# Patient Record
Sex: Female | Born: 1950 | Race: White | Hispanic: No | Marital: Married | Smoking: Never smoker
Health system: Southern US, Community
[De-identification: ages and names within clinical notes are randomized; demographics above are authoritative.]

## PROBLEM LIST (undated history)

## (undated) DIAGNOSIS — Z86718 Personal history of other venous thrombosis and embolism: Secondary | ICD-10-CM

## (undated) DIAGNOSIS — E611 Iron deficiency: Secondary | ICD-10-CM

## (undated) DIAGNOSIS — I10 Essential (primary) hypertension: Secondary | ICD-10-CM

## (undated) DIAGNOSIS — F1320 Sedative, hypnotic or anxiolytic dependence, uncomplicated: Secondary | ICD-10-CM

## (undated) DIAGNOSIS — F603 Borderline personality disorder: Secondary | ICD-10-CM

## (undated) DIAGNOSIS — C55 Malignant neoplasm of uterus, part unspecified: Secondary | ICD-10-CM

## (undated) DIAGNOSIS — F329 Major depressive disorder, single episode, unspecified: Secondary | ICD-10-CM

## (undated) DIAGNOSIS — G2581 Restless legs syndrome: Secondary | ICD-10-CM

## (undated) DIAGNOSIS — I272 Pulmonary hypertension, unspecified: Secondary | ICD-10-CM

## (undated) DIAGNOSIS — G43909 Migraine, unspecified, not intractable, without status migrainosus: Secondary | ICD-10-CM

## (undated) DIAGNOSIS — F419 Anxiety disorder, unspecified: Secondary | ICD-10-CM

## (undated) HISTORY — DX: Anxiety disorder, unspecified: F41.9

## (undated) HISTORY — DX: Malignant neoplasm of uterus, part unspecified: C55

## (undated) HISTORY — DX: Essential (primary) hypertension: I10

## (undated) HISTORY — PX: CARDIAC CATHETERIZATION: SHX172

## (undated) HISTORY — PX: ABDOMINAL HYSTERECTOMY: SHX81

## (undated) HISTORY — PX: KNEE ARTHROSCOPY: SUR90

---
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
## 2016-01-12 DIAGNOSIS — F329 Major depressive disorder, single episode, unspecified: Secondary | ICD-10-CM

## 2016-01-12 DIAGNOSIS — G43909 Migraine, unspecified, not intractable, without status migrainosus: Secondary | ICD-10-CM

## 2016-01-12 HISTORY — DX: Migraine, unspecified, not intractable, without status migrainosus: G43.909

## 2016-01-12 HISTORY — DX: Major depressive disorder, single episode, unspecified: F32.9

## 2016-07-03 DIAGNOSIS — F603 Borderline personality disorder: Secondary | ICD-10-CM

## 2016-07-03 HISTORY — DX: Borderline personality disorder: F60.3

## 2017-03-07 DIAGNOSIS — G2581 Restless legs syndrome: Secondary | ICD-10-CM

## 2017-03-07 HISTORY — DX: Restless legs syndrome: G25.81

## 2017-08-23 DIAGNOSIS — I272 Pulmonary hypertension, unspecified: Secondary | ICD-10-CM

## 2017-08-23 HISTORY — DX: Pulmonary hypertension, unspecified: I27.20

## 2017-08-24 DIAGNOSIS — E611 Iron deficiency: Secondary | ICD-10-CM

## 2017-08-24 HISTORY — DX: Iron deficiency: E61.1

## 2017-09-08 DIAGNOSIS — Z86718 Personal history of other venous thrombosis and embolism: Secondary | ICD-10-CM

## 2017-09-08 HISTORY — DX: Personal history of other venous thrombosis and embolism: Z86.718

## 2017-11-10 DIAGNOSIS — F1320 Sedative, hypnotic or anxiolytic dependence, uncomplicated: Secondary | ICD-10-CM

## 2017-11-10 HISTORY — DX: Sedative, hypnotic or anxiolytic dependence, uncomplicated: F13.20

## 2018-07-25 ENCOUNTER — Telehealth: Payer: Self-pay | Admitting: *Deleted

## 2018-07-25 NOTE — Telephone Encounter (Addendum)
Patient called back and was giving an appt time of 7/10 at 9:30am. Gave  The patient address, instructions on no visitors, masking and check in. Per patient request I have faxed directions and maps

## 2018-07-25 NOTE — Telephone Encounter (Signed)
Called and left the patient a message to call the office back. Need to schedule a new patient appt  

## 2018-07-26 ENCOUNTER — Telehealth: Payer: Self-pay

## 2018-07-26 NOTE — Telephone Encounter (Signed)
LM with receptionist that pt has appointment 08-11-18 at 0930 with Dr. Everitt Amber. Receptionist will relay this information to [place] as she requested.

## 2018-08-11 ENCOUNTER — Encounter: Payer: Self-pay | Admitting: Hematology and Oncology

## 2018-08-11 ENCOUNTER — Other Ambulatory Visit: Payer: Self-pay

## 2018-08-11 ENCOUNTER — Encounter: Payer: Self-pay | Admitting: Gynecologic Oncology

## 2018-08-11 ENCOUNTER — Inpatient Hospital Stay: Payer: Medicare Other

## 2018-08-11 ENCOUNTER — Encounter: Payer: Self-pay | Admitting: Oncology

## 2018-08-11 ENCOUNTER — Telehealth: Payer: Self-pay | Admitting: Oncology

## 2018-08-11 ENCOUNTER — Inpatient Hospital Stay: Payer: Medicare Other | Attending: Gynecologic Oncology | Admitting: Gynecologic Oncology

## 2018-08-11 VITALS — BP 169/96 | HR 65 | Temp 98.5°F | Resp 17 | Ht 59.0 in | Wt 156.0 lb

## 2018-08-11 DIAGNOSIS — Z86718 Personal history of other venous thrombosis and embolism: Secondary | ICD-10-CM | POA: Diagnosis not present

## 2018-08-11 DIAGNOSIS — Z79899 Other long term (current) drug therapy: Secondary | ICD-10-CM | POA: Insufficient documentation

## 2018-08-11 DIAGNOSIS — C55 Malignant neoplasm of uterus, part unspecified: Secondary | ICD-10-CM | POA: Insufficient documentation

## 2018-08-11 DIAGNOSIS — C541 Malignant neoplasm of endometrium: Secondary | ICD-10-CM

## 2018-08-11 DIAGNOSIS — F1320 Sedative, hypnotic or anxiolytic dependence, uncomplicated: Secondary | ICD-10-CM | POA: Insufficient documentation

## 2018-08-11 DIAGNOSIS — G2581 Restless legs syndrome: Secondary | ICD-10-CM | POA: Diagnosis not present

## 2018-08-11 DIAGNOSIS — F603 Borderline personality disorder: Secondary | ICD-10-CM | POA: Diagnosis not present

## 2018-08-11 DIAGNOSIS — I272 Pulmonary hypertension, unspecified: Secondary | ICD-10-CM | POA: Insufficient documentation

## 2018-08-11 DIAGNOSIS — Z6831 Body mass index (BMI) 31.0-31.9, adult: Secondary | ICD-10-CM | POA: Diagnosis not present

## 2018-08-11 DIAGNOSIS — F329 Major depressive disorder, single episode, unspecified: Secondary | ICD-10-CM | POA: Insufficient documentation

## 2018-08-11 DIAGNOSIS — Z90710 Acquired absence of both cervix and uterus: Secondary | ICD-10-CM

## 2018-08-11 DIAGNOSIS — E669 Obesity, unspecified: Secondary | ICD-10-CM | POA: Insufficient documentation

## 2018-08-11 DIAGNOSIS — Z90721 Acquired absence of ovaries, unilateral: Secondary | ICD-10-CM

## 2018-08-11 DIAGNOSIS — I10 Essential (primary) hypertension: Secondary | ICD-10-CM | POA: Insufficient documentation

## 2018-08-11 DIAGNOSIS — F419 Anxiety disorder, unspecified: Secondary | ICD-10-CM | POA: Diagnosis not present

## 2018-08-11 DIAGNOSIS — C482 Malignant neoplasm of peritoneum, unspecified: Secondary | ICD-10-CM | POA: Diagnosis present

## 2018-08-11 LAB — BASIC METABOLIC PANEL
Anion gap: 11 (ref 5–15)
BUN: 12 mg/dL (ref 8–23)
CO2: 23 mmol/L (ref 22–32)
Calcium: 8.4 mg/dL — ABNORMAL LOW (ref 8.9–10.3)
Chloride: 111 mmol/L (ref 98–111)
Creatinine, Ser: 0.68 mg/dL (ref 0.44–1.00)
GFR calc Af Amer: >60 mL/min (ref >60)
GFR calc non Af Amer: >60 mL/min (ref >60)
Glucose, Bld: 91 mg/dL (ref 70–99)
Potassium: 3.7 mmol/L (ref 3.5–5.1)
Sodium: 145 mmol/L (ref 135–145)

## 2018-08-11 NOTE — Patient Instructions (Addendum)
You carry a diagnosis of stage 1B uterine (endometrial) cancer. 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 Therefore Dr Denman George is recommending scans of the abdomen and chest to look for obvious spread of the cancer. She is also recommending 6 doses of chemotherapy and 5 doses of vaginal radiation to decrease the chance that the cancer will return.   You will have visits scheduled with the chemo doctor and the radiation doctor to discuss further.   Dr Serita Grit office can be reached at [phone].

## 2018-08-11 NOTE — Telephone Encounter (Signed)
Called Robin at [facility] and requested MSI and Her2 testing on accession [id number].   

## 2018-08-11 NOTE — Progress Notes (Signed)
Met with Christina Hickman after first [place] appointment.  Discussed appointments with Dr. Alvy Bimler and Dr. Sondra Come and gave her CT instructions for 08/15/18.  She has been given the [employer] and was advised to call with any questions or concerns.

## 2018-08-11 NOTE — Progress Notes (Signed)
Consult Note: Gyn-Onc  Consult was requested by Dr. Adah Perl for the evaluation of Christina Hickman 68 y.o. female  CC:  Chief Complaint  Patient presents with  . Primary peritoneal adenocarcinoma [facility])    Assessment/Plan:  Christina Hickman  is a 68 y.o.  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  Madesyn was on staged and the left ovary remains, however we will obtain radiographic staging with CT chest abdomen and pelvis.  Provided no gross residual disease is remaining on scans she is a candidate for adjuvant therapy given her deeply invasive high-grade endometrial cancer.  Therefore it is unlikely that staging surgery would have any benefit for her in the setting now.  I am recommending 6 cycles of carboplatin paclitaxel chemotherapy with vaginal brachii therapy for high intermediate risk factor uterine cancer.  I explained these treatments to the patient and the indication and prognosis of her underlying cancer.  We will proceed with imaging and referral to medical oncology and radiation oncology.  We will obtain HER-2 testing on the serous portion of her serous endometrial cancer and consider trastuzumab if she is HER-2 positive.   HPI: Christina Hickman is a 68 year old P1 who is seen in consultation at the request of Dr Adah Perl for evaluation of endometrial cancer.  She had experienced vaginal postmenopausal bleeding in March, 2020 which became heavier through May, 2020.  Reports "years" of vaginal bleeding after heavy straining/lifting (eg using the push mower).    She saw Dr Adah Perl in [place] on 06/06/18 and a pap was performed which showed ASCUS. She also performed a TVUS which showed a uterus measuring 8.6x2.8x4.6cm with a thickened 3cm endometrum with multiple cystic areas throughout the endometrium.  An endometrial biopsy was performed on 06/19/18 which showed a  "benign tumor" (pathology report from 06/19/18 showed an endometrial polyp). She was set up for hysterectomy.  On 07/17/18 she underwent an LAVH and right salpingo-oophorectomy. The left tube and ovary was adherent to the sigmoid colon and was not removed.   The pathology revealed a mixed endometrioid and serous carcinoma of the endometrium (FIGO grade 3). It showed 64m of 169mmyometrial invasion (outer half), no LVSI. Lymph nodes were not examined. The cervical stroma and right tube and ovary were normal.  She had no issues postoperatively.   Her medical history is significant for anxiety and depression, borderline PD, essential HTN, restless legs, pulmonary HTN, obesity (BMI 31), history of DVT (2015 approximately - on blood thinner for 6 months possibly).  Family history is significant for no malignancy.  Her surgical history is significant for right knee surgery and a hysterectomy.   Current Meds:  Outpatient Encounter Medications as of 08/11/2018  Medication Sig  . clonazePAM (KLONOPIN) 1 MG tablet 1 mg 2 (two) times daily.   . Nebivolol HCl (BYSTOLIC) 20 MG TABS Bystolic 20 mg tablet  TAKE ONE TABLET BY MOUTH DAILY  . omeprazole (PRILOSEC) 40 MG capsule Take 40 mg by mouth daily.   . Marland Kitchencetaminophen (TYLENOL) 500 MG tablet Take 500 mg by mouth every 6 (six) hours as needed.  . Marland KitchenPINEPHrine 0.3 mg/0.3 mL IJ SOAJ injection epinephrine 0.3 mg/0.3 mL injection, auto-injector  USE AS DIRECTED  . nitroGLYCERIN (NITROSTAT) 0.4 MG SL tablet nitroglycerin 0.4 mg sublingual tablet  . [DISCONTINUED] aspirin 81 MG EC tablet Take 81 mg by mouth daily. Swallow whole.  . [DISCONTINUED] BYSTOLIC  20 MG TABS   . [DISCONTINUED] EPINEPHrine 0.3 mg/0.3 mL IJ SOAJ injection   . [DISCONTINUED] ibuprofen (ADVIL) 400 MG tablet   . [DISCONTINUED] Naproxen Sodium (ALEVE) 220 MG CAPS Take 220 mg by mouth every 8 (eight) hours as needed.   No facility-administered encounter medications on file as of 08/11/2018.      Allergy:  Allergies  Allergen Reactions  . Cephalexin Rash  . Losartan Other (See Comments)    Angioedema   . Shellfish-Derived Products Swelling  . Sulfa Antibiotics Hives  . Serotonin Other (See Comments)    Confusion  . Alendronate Other (See Comments)  . Beef-Derived Products   . Galactose   . Lisinopril Swelling    Lip swelling    Social Hx:   Social History   Socioeconomic History  . Marital status: Married    Spouse name: Not on file  . Number of children: Not on file  . Years of education: Not on file  . Highest education level: Not on file  Occupational History  . Not on file  Social Needs  . Financial resource strain: Not on file  . Food insecurity    Worry: Not on file    Inability: Not on file  . Transportation needs    Medical: Not on file    Non-medical: Not on file  Tobacco Use  . Smoking status: Never Smoker  . Smokeless tobacco: Never Used  Substance and Sexual Activity  . Alcohol use: Not Currently    Comment: former use  . Drug use: Not on file  . Sexual activity: Not Currently  Lifestyle  . Physical activity    Days per week: Not on file    Minutes per session: Not on file  . Stress: Not on file  Relationships  . Social Herbalist on phone: Not on file    Gets together: Not on file    Attends religious service: Not on file    Active member of club or organization: Not on file    Attends meetings of clubs or organizations: Not on file    Relationship status: Not on file  . Intimate partner violence    Fear of current or ex partner: Not on file    Emotionally abused: Not on file    Physically abused: Not on file    Forced sexual activity: Not on file  Other Topics Concern  . Not on file  Social History Narrative  . Not on file    Past Surgical Hx:  Past Surgical History:  Procedure Laterality Date  . CARDIAC CATHETERIZATION    . KNEE ARTHROSCOPY Right     Past Medical Hx:  Past Medical History:  Diagnosis  Date  . Anxiety   . Benzodiazepine dependence ([place]) 11/10/2017  . Borderline personality disorder ([place]) 07/03/2016  . History of deep vein thrombosis 09/08/2017  . Hypertension   . Iron deficiency 08/24/2017  . Major depressive disorder 01/12/2016  . Migraine 01/12/2016  . Pulmonary hypertension ([place]) 08/23/2017  . Restless leg 03/07/2017  . Uterine cancer [facility])     Past Gynecological History:  1 SVD. + postmenopausal bleeding. No LMP recorded.  Family Hx:  Family History  Problem Relation Age of Onset  . Heart attack Father   . Heart attack Maternal Uncle   . Diabetes Paternal Uncle     Review of Systems:  Constitutional  Feels well,    ENT Normal appearing ears and nares bilaterally Skin/Breast  No  rash, sores, jaundice, itching, dryness Cardiovascular  No chest pain, shortness of breath, or edema  Pulmonary  No cough or wheeze.  Gastro Intestinal  No nausea, vomitting, or diarrhoea. No bright red blood per rectum, no abdominal pain, change in bowel movement, or constipation.  Genito Urinary  No frequency, urgency, dysuria, no bleeding Musculo Skeletal  No myalgia, arthralgia, joint swelling or pain  Neurologic  No weakness, numbness, change in gait,  Psychology  No depression, anxiety, insomnia.   Vitals:  Blood pressure (!) 169/96, pulse 65, temperature 98.5 F (36.9 C), temperature source Oral, resp. rate 17, height 4' 11"  (1.499 m), weight 156 lb (70.8 kg), SpO2 100 %.  Physical Exam: WD in NAD Neck  Supple NROM, without any enlargements.  Lymph Node Survey No cervical supraclavicular or inguinal adenopathy Cardiovascular  Pulse normal rate, regularity and rhythm. S1 and S2 normal.  Lungs  Clear to auscultation bilateraly, without wheezes/crackles/rhonchi. Good air movement.  Skin  No rash/lesions/breakdown  Psychiatry  Alert and oriented to person, place, and time  Abdomen  Normoactive bowel sounds, abdomen soft, non-tender and obese without  evidence of hernia. Incisions healing well Back No CVA tenderness Genito Urinary  Vulva/vagina: Normal external female genitalia.   No lesions. No discharge or bleeding.  Bladder/urethra:  No lesions or masses, well supported bladder  Vagina: vaginal cuff in tact with suture material present. No lesions, no active bleeding.  Adnexa: no palpable masses. Rectal  deferred Extremities  No bilateral cyanosis, clubbing or edema.   Thereasa Solo, MD  08/11/2018, 6:39 PM

## 2018-08-14 ENCOUNTER — Telehealth: Payer: Self-pay | Admitting: *Deleted

## 2018-08-14 NOTE — Telephone Encounter (Signed)
Patient called and stated "I was reading over the instructions for the test I'm having this week, and it talked about iodine and I'm allergic. I called and talked with radiology, they tolad me to call Dr. Serita Grit office and ask for a 13 hour prep." Per Melissa APP and Dr. Denman George "it is a different type of dye and she is fine without the prep. No iodine."; gave the information to the patient. Patient verbalized understanding.

## 2018-08-14 NOTE — Progress Notes (Signed)
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  Reports "years" of vaginal bleeding after heavy straining/lifting (eg using the push mower).    She saw Dr Adah Perl in [place] on 06/06/18 and a pap was performed which showed ASCUS. She also performed a TVUS which showed a uterus measuring 8.6x2.8x4.6cm with a thickened 3cm endometrum with multiple cystic areas throughout the endometrium.  An endometrial biopsy was performed on 06/19/18 which showed a "benign tumor" (pathology report from 06/19/18 showed an endometrial polyp). She was set up for hysterectomy.  On 07/17/18 she underwent an LAVH and right salpingo-oophorectomy. The left tube and ovary was adherent to the sigmoid colon and was not removed.   The pathology revealed a mixed endometrioid and serous carcinoma of the endometrium (FIGO grade 3). It showed 27m of 162mmyometrial invasion (outer half), no LVSI. Lymph nodes were not examined. The cervical stroma and right tube and ovary were normal.  Biopsies revealed: The pathology revealed a mixed endometrioid and serous carcinoma of the endometrium (FIGO grade 3). It showed 55m37mf 23m17mometrial invasion (outer half), no LVSI. Lymph nodes were not examined. The cervical stroma and right tube and ovary were normal.  Past/Anticipated interventions by Gyn/Onc surgery, if any: None at this time. CT ABD 08/15/18: IMPRESSION: 1. Single indeterminate 8 mm aorto-caval retroperitoneal lymph node. No other signs metastatic disease within the chest, abdomen, or pelvis. 2. Moderate to large hiatal hernia. 3. Colonic diverticulosis. No radiographic evidence of diverticulitis.  Past/Anticipated interventions by medical oncology, if any: Initial consult Dr. GorsAlvy Bimler4/20: ASSESSMENT & PLAN:   [place] We discussed the role of adjuvant treatment  We reviewed the NCCN guidelines We discussed the role of chemotherapy. The intent is of curative intent.  We discussed some of the risks, benefits, side-effects of carboplatin & Taxol. Treatment is intravenous, every 3 weeks x 6 cycles  Some of the short term side-effects included, though not limited to, including weight loss, life threatening infections, risk of allergic reactions, need for transfusions of blood products, nausea, vomiting, change in bowel habits, loss of hair, admission to hospital for various reasons, and risks of death.   Long term side-effects are also discussed including risks of infertility, permanent damage to nerve function, hearing loss, chronic fatigue, kidney damage with possibility needing hemodialysis, and rare secondary malignancy including bone marrow disorders.  The patient is aware that the response rates discussed earlier is not guaranteed.  After a long discussion, patient made an informed decision to proceed with the prescribed plan of care.   Patient education material was dispensed. We discussed premedication with dexamethasone before chemotherapy. Due to her propensity for fluid retention, I will reduce oral premedication dexamethasone a little bit I do not plan prophylactic G-CSF support Due to high calculated BSA, I plan to reduce Taxol a little bit upfront I will try to get her scheduled for chemo education class, port placement and chemotherapy to start next week I will see her prior to cycle 2 of treatment We will get pathologist to add Her-2 testing on the serous component of her disease  Weight changes, if any:  Wt Readings from Last 3 Encounters:  08/16/18 152 lb 9.6 oz (69.2 kg)  08/15/18 151 lb (68.5 kg)  08/11/18 156 lb (  70.8 kg)     Bowel/Bladder complaints, if any: Pt denies dysuria/hematuria. Pt reports minimal  vaginal bleeding, bright red. Pt denies diarrhea/constipation.   Nausea/Vomiting, if any: Pt reports nausea at 0230 this am, attributes it to CT contrast/barium. Pt denies abdominal bloating.  Pain issues, if any:  Pt denies c/o pain.  SAFETY ISSUES:  Prior radiation? No  Pacemaker/ICD? No  Possible current pregnancy? No  Is the patient on methotrexate? No  Current Complaints / other details:  Pt presents today for initial consult with Dr. Sondra Come for Radiation Oncology.   BP (!) 189/87 (BP Location: Left Arm, Patient Position: Sitting)   Pulse 68   Temp 98.7 F (37.1 C) (Oral)   Resp 18   Ht 4' 11"  (1.499 m)   Wt 152 lb 9.6 oz (69.2 kg)   SpO2 100%   BMI 30.82 kg/m   Loma Sousa, RN BSN

## 2018-08-15 ENCOUNTER — Inpatient Hospital Stay: Payer: Medicare Other

## 2018-08-15 ENCOUNTER — Encounter: Payer: Self-pay | Admitting: Hematology and Oncology

## 2018-08-15 ENCOUNTER — Other Ambulatory Visit: Payer: Medicare Other

## 2018-08-15 ENCOUNTER — Inpatient Hospital Stay (HOSPITAL_BASED_OUTPATIENT_CLINIC_OR_DEPARTMENT_OTHER): Payer: Medicare Other | Admitting: Hematology and Oncology

## 2018-08-15 ENCOUNTER — Ambulatory Visit (HOSPITAL_COMMUNITY)
Admission: RE | Admit: 2018-08-15 | Discharge: 2018-08-15 | Disposition: A | Discharge status: Home / Self Care | Payer: Medicare Other | Source: Ambulatory Visit | Attending: Gynecologic Oncology | Admitting: Gynecologic Oncology

## 2018-08-15 ENCOUNTER — Other Ambulatory Visit: Payer: Self-pay

## 2018-08-15 ENCOUNTER — Encounter (HOSPITAL_COMMUNITY): Payer: Self-pay

## 2018-08-15 VITALS — BP 154/88 | HR 65 | Temp 98.3°F | Resp 18 | Ht 59.0 in | Wt 151.0 lb

## 2018-08-15 DIAGNOSIS — C541 Malignant neoplasm of endometrium: Secondary | ICD-10-CM | POA: Diagnosis present

## 2018-08-15 DIAGNOSIS — C55 Malignant neoplasm of uterus, part unspecified: Secondary | ICD-10-CM | POA: Insufficient documentation

## 2018-08-15 DIAGNOSIS — C549 Malignant neoplasm of corpus uteri, unspecified: Secondary | ICD-10-CM | POA: Diagnosis not present

## 2018-08-15 DIAGNOSIS — C482 Malignant neoplasm of peritoneum, unspecified: Secondary | ICD-10-CM | POA: Diagnosis not present

## 2018-08-15 MED ORDER — IOHEXOL 300 MG/ML  SOLN
100.0000 mL | Freq: Once | INTRAMUSCULAR | Status: AC | PRN
  Administered 2018-08-15: 08:00:00 100 mL via INTRAVENOUS

## 2018-08-15 MED ORDER — ONDANSETRON HCL 8 MG PO TABS: 8.0000 mg | ORAL_TABLET | Freq: Three times a day (TID) | ORAL | 1 refills | Status: DC | PRN

## 2018-08-15 MED ORDER — SODIUM CHLORIDE (PF) 0.9 % IJ SOLN
INTRAMUSCULAR | Status: AC
  Filled 2018-08-15: qty 50

## 2018-08-15 MED ORDER — LIDOCAINE-PRILOCAINE 2.5-2.5 % EX CREA: TOPICAL_CREAM | CUTANEOUS | 3 refills | Status: AC

## 2018-08-15 MED ORDER — DEXAMETHASONE 4 MG PO TABS: ORAL_TABLET | ORAL | 0 refills | Status: DC

## 2018-08-15 MED ORDER — PROCHLORPERAZINE MALEATE 10 MG PO TABS: 10.0000 mg | ORAL_TABLET | Freq: Four times a day (QID) | ORAL | 1 refills | Status: DC | PRN

## 2018-08-15 MED FILL — PROCHLORPERAZINE 10 MG TAB: 10 | 7 days supply | Qty: 30 | Fill #0

## 2018-08-15 MED FILL — LIDOCAINE-PRILOCAINE CREAM: 2.5-2.5 | 10 days supply | Qty: 30 | Fill #0

## 2018-08-15 MED FILL — DEXAMETHASONE 4 MG TABLET: 4 | 84 days supply | Qty: 16 | Fill #0

## 2018-08-15 MED FILL — ONDANSETRON HCL 8 MG TABLET: 8 | 10 days supply | Qty: 30 | Fill #0

## 2018-08-15 NOTE — Progress Notes (Signed)
Went to waiting area to introduce myself as Arboriculturist and to offer available resources.  Patient has 2 insurances therefore no copay assistance is needed.  Discussed the one-time $1000 Advertising account executive and qualifications to assist with personal expenses while going through treatment. Patient states they are over the income guidelines quoted for their household size.  Gave her my card for any additional financial questions or concerns.

## 2018-08-15 NOTE — Progress Notes (Signed)
[place] NOTE  Patient Care Team: [name], DO as PCP - General [facility])  ASSESSMENT & PLAN:  Uterine cancer [facility]) I have reviewed imaging study with the patient's We discussed the role of adjuvant treatment  We reviewed the NCCN guidelines We discussed the role of chemotherapy. The intent is of curative intent.  We discussed some of the risks, benefits, side-effects of carboplatin & Taxol. Treatment is intravenous, every 3 weeks x 6 cycles  Some of the short term side-effects included, though not limited to, including weight loss, life threatening infections, risk of allergic reactions, need for transfusions of blood products, nausea, vomiting, change in bowel habits, loss of hair, admission to hospital for various reasons, and risks of death.   Long term side-effects are also discussed including risks of infertility, permanent damage to nerve function, hearing loss, chronic fatigue, kidney damage with possibility needing hemodialysis, and rare secondary malignancy including bone marrow disorders.  The patient is aware that the response rates discussed earlier is not guaranteed.  After a long discussion, patient made an informed decision to proceed with the prescribed plan of care.   Patient education material was dispensed. We discussed premedication with dexamethasone before chemotherapy. Due to her propensity for fluid retention, I will reduce oral premedication dexamethasone a little bit I do not plan prophylactic G-CSF support Due to high calculated BSA, I plan to reduce Taxol a little bit upfront I will try to get her scheduled for chemo education class, port placement and chemotherapy to start next week I will see her prior to cycle 2 of treatment We will get pathologist to add Her-2 testing on the serous component of her disease   Orders Placed This Encounter  Procedures  . IR IMAGING GUIDED PORT INSERTION    Standing Status:    Future    Standing Expiration Date:   10/16/2019    Order Specific Question:   Reason for Exam (SYMPTOM  OR DIAGNOSIS REQUIRED)    Answer:   need port for chemo to start 7/22    Order Specific Question:   Preferred Imaging Location?    Answer:   [facility]  . CBC with Differential ([place] Only)    Standing Status:   Standing    Number of Occurrences:   20    Standing Expiration Date:   08/15/2019  . CMP ([place] only)    Standing Status:   Standing    Number of Occurrences:   20    Standing Expiration Date:   08/15/2019     CHIEF COMPLAINTS/PURPOSE OF CONSULTATION:  Endometrial cancer, for adjuvant treatment  HISTORY OF PRESENTING ILLNESS:  Christina Hickman 68 y.o. female is here because of recent diagnosis of endometrial cancer I have reviewed her chart and materials related to her cancer extensively and collaborated history with the patient. Summary of oncologic history is as follows: Oncology History  Uterine cancer ([place])  06/06/2018 Initial Diagnosis   She presented with postmenopausal bleeding. Initial biopsy was negative for malignancy   06/06/2018 Pathology Results   She saw Dr Adah Perl in [place] on 06/06/18 and a pap was performed which showed ASCUS.   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 The left tube and ovary was adherent to the sigmoid colon and was not removed.    08/11/2018 Initial Diagnosis   Uterine cancer ([place])   08/15/2018 Imaging   CT scan of  chest, abdomen and pelvis 1. Single indeterminate 8 mm aorto-caval retroperitoneal lymph node. No other signs metastatic disease within the chest, abdomen, or pelvis. 2. Moderate to large hiatal hernia. 3. Colonic diverticulosis. No radiographic evidence of diverticulitis.   08/15/2018 Cancer Staging   Staging form: Corpus Uteri - Carcinoma and Carcinosarcoma, AJCC 8th Edition - Pathologic: Stage Unknown (pT1b, pNX, cM0) - Signed by Heath Lark, MD on 08/15/2018    She is recovering well from surgery She is gaining some weight back She denies pain No recent changes in bowel habits No further vaginal bleeding  MEDICAL HISTORY:  Past Medical History:  Diagnosis Date  . Anxiety   . Benzodiazepine dependence ([place]) 11/10/2017  . Borderline personality disorder ([place]) 07/03/2016  . History of deep vein thrombosis 09/08/2017  . Hypertension   . Iron deficiency 08/24/2017  . Major depressive disorder 01/12/2016  . Migraine 01/12/2016  . Pulmonary hypertension ([place]) 08/23/2017  . Restless leg 03/07/2017  . Uterine cancer [facility])     SURGICAL HISTORY: Past Surgical History:  Procedure Laterality Date  . ABDOMINAL HYSTERECTOMY    . CARDIAC CATHETERIZATION    . KNEE ARTHROSCOPY Right     SOCIAL HISTORY: Social History   Socioeconomic History  . Marital status: Married    Spouse name: Legrand Como   . Number of children: 1  . Years of education: Not on file  . Highest education level: Not on file  Occupational History  . Occupation: farmer  Social Needs  . Financial resource strain: Not on file  . Food insecurity    Worry: Not on file    Inability: Not on file  . Transportation needs    Medical: Not on file    Non-medical: Not on file  Tobacco Use  . Smoking status: Never Smoker  . Smokeless tobacco: Never Used  Substance and Sexual Activity  . Alcohol use: Not Currently    Comment: former use  . Drug use: Not on file  . Sexual activity: Not Currently  Lifestyle  .  Physical activity    Days per week: Not on file    Minutes per session: Not on file  . Stress: Not on file  Relationships  . Social Herbalist on phone: Not on file    Gets together: Not on file    Attends religious service: Not on file    Active member of club or organization: Not on file    Attends meetings of clubs or organizations: Not on file    Relationship status: Not on file  . Intimate partner violence    Fear of current or ex partner: Not on file    Emotionally abused: Not on file    Physically abused: Not on file    Forced sexual activity: Not on file  Other Topics Concern  . Not on file  Social History Narrative  . Not on file    FAMILY HISTORY: Family History  Problem Relation Age of Onset  . Heart attack Father   . Heart attack Maternal Uncle   . Diabetes Paternal Uncle  ALLERGIES:  is allergic to cephalexin; losartan; shellfish-derived products; sulfa antibiotics; serotonin; alendronate; beef-derived products; galactose; and lisinopril.  MEDICATIONS:  Current Outpatient Medications  Medication Sig Dispense Refill  . acetaminophen (TYLENOL) 500 MG tablet Take 500 mg by mouth every 6 (six) hours as needed.    . clonazePAM (KLONOPIN) 1 MG tablet 1 mg 2 (two) times daily.     Marland Kitchen dexamethasone (DECADRON) 4 MG tablet Take 2  tabs at the night before and 2 tab the morning of chemotherapy, every 3 weeks, by mouth 24 tablet 0  . EPINEPHrine 0.3 mg/0.3 mL IJ SOAJ injection epinephrine 0.3 mg/0.3 mL injection, auto-injector  USE AS DIRECTED    . lidocaine-prilocaine (EMLA) cream Apply to affected area once 30 g 3  . Nebivolol HCl (BYSTOLIC) 20 MG TABS Bystolic 20 mg tablet  TAKE ONE TABLET BY MOUTH DAILY    . nitroGLYCERIN (NITROSTAT) 0.4 MG SL tablet nitroglycerin 0.4 mg sublingual tablet    . omeprazole (PRILOSEC) 40 MG capsule Take 40 mg by mouth daily.     . ondansetron (ZOFRAN) 8 MG tablet Take 1 tablet (8 mg total) by mouth every 8 (eight) hours as  needed for refractory nausea / vomiting. 30 tablet 1  . prochlorperazine (COMPAZINE) 10 MG tablet Take 1 tablet (10 mg total) by mouth every 6 (six) hours as needed (Nausea or vomiting). 30 tablet 1   No current facility-administered medications for this visit.    Facility-Administered Medications Ordered in Other Visits  Medication Dose Route Frequency Provider Last Rate Last Dose  . sodium chloride (PF) 0.9 % injection             REVIEW OF SYSTEMS:   Constitutional: Denies fevers, chills or abnormal night sweats Eyes: Denies blurriness of vision, double vision or watery eyes Ears, nose, mouth, throat, and face: Denies mucositis or sore throat Respiratory: Denies cough, dyspnea or wheezes Cardiovascular: Denies palpitation, chest discomfort or lower extremity swelling Gastrointestinal:  Denies nausea, heartburn or change in bowel habits Skin: Denies abnormal skin rashes Lymphatics: Denies new lymphadenopathy or easy bruising Neurological:Denies numbness, tingling or new weaknesses Behavioral/Psych: Mood is stable, no new changes  All other systems were reviewed with the patient and are negative.  PHYSICAL EXAMINATION: ECOG PERFORMANCE STATUS: 1 - Symptomatic but completely ambulatory  Vitals:   08/15/18 1029  BP: (!) 154/88  Pulse: 65  Resp: 18  Temp: 98.3 F (36.8 C)  SpO2: 100%   Filed Weights   08/15/18 1029  Weight: 151 lb (68.5 kg)    GENERAL:alert, no distress and comfortable SKIN: skin color, texture, turgor are normal, no rashes or significant lesions EYES: normal, conjunctiva are pink and non-injected, sclera clear OROPHARYNX:no exudate, no erythema and lips, buccal mucosa, and tongue normal  NECK: supple, thyroid normal size, non-tender, without nodularity LYMPH:  no palpable lymphadenopathy in the cervical, axillary or inguinal LUNGS: clear to auscultation and percussion with normal breathing effort HEART: regular rate & rhythm and no murmurs and no lower  extremity edema ABDOMEN:abdomen soft, non-tender and normal bowel sounds.  Noted well-healed surgical scar Musculoskeletal:no cyanosis of digits and no clubbing  PSYCH: alert & oriented x 3 with fluent speech NEURO: no focal motor/sensory deficits  LABORATORY DATA:  I have reviewed the data as listed No results found for: WBC, HGB, HCT, MCV, PLT Recent Labs    08/11/18 1132  NA 145  K 3.7  CL 111  CO2 23  GLUCOSE 91  BUN 12  CREATININE 0.68  CALCIUM 8.4*  GFRNONAA >60  GFRAA >60    RADIOGRAPHIC STUDIES: I have reviewed imaging studies with the patient I have personally reviewed the radiological images as listed and agreed with the findings in the report. Ct Chest W Contrast  Result Date: 08/15/2018 CLINICAL DATA:  Newly diagnosed endometrial carcinoma. Status hysterectomy 3 weeks ago. Staging. EXAM: CT CHEST, ABDOMEN, AND PELVIS WITH CONTRAST TECHNIQUE: Multidetector CT imaging of the chest, abdomen and pelvis was performed following the standard protocol during bolus administration of intravenous contrast. CONTRAST:  167m OMNIPAQUE IOHEXOL 300 MG/ML  SOLN COMPARISON:  None. FINDINGS: CT CHEST FINDINGS Cardiovascular: No acute findings. Mediastinum/Lymph Nodes: No masses or pathologically enlarged lymph nodes identified. Lungs/Pleura: No pulmonary infiltrate or mass identified. No effusion present. Musculoskeletal:  No suspicious bone lesions identified. CT ABDOMEN AND PELVIS FINDINGS Hepatobiliary: No masses identified. Gallbladder is unremarkable. Pancreas:  No mass or inflammatory changes. Spleen:  Within normal limits in size and appearance. Adrenals/Urinary tract:  No masses or hydronephrosis. Stomach/Bowel: Moderate to large hiatal hernia. No evidence of obstruction, inflammatory process, or abnormal fluid collections. Diverticulosis is seen mainly involving the sigmoid colon, however there is no evidence of diverticulitis. Vascular/Lymphatic: No pathologically enlarged lymph nodes  identified. An aortocaval lymph node measuring 8 mm is noted image 69/2. No abdominal aortic aneurysm. Reproductive: Expected postop changes from hysterectomy. No pelvic mass or abnormal fluid collections identified. Other:  None. Musculoskeletal: No suspicious bone lesions identified. Old benign appearing fracture deformities seen involving the right superior and inferior pubic rami. IMPRESSION: 1. Single indeterminate 8 mm aorto-caval retroperitoneal lymph node. No other signs metastatic disease within the chest, abdomen, or pelvis. 2. Moderate to large hiatal hernia. 3. Colonic diverticulosis. No radiographic evidence of diverticulitis. Electronically Signed   By: JMarlaine HindM.D.   On: 08/15/2018 08:39   Ct Abdomen Pelvis W Contrast  Result Date: 08/15/2018 CLINICAL DATA:  Newly diagnosed endometrial carcinoma. Status hysterectomy 3 weeks ago. Staging. EXAM: CT CHEST, ABDOMEN, AND PELVIS WITH CONTRAST TECHNIQUE: Multidetector CT imaging of the chest, abdomen and pelvis was performed following the standard protocol during bolus administration of intravenous contrast. CONTRAST:  1020mOMNIPAQUE IOHEXOL 300 MG/ML  SOLN COMPARISON:  None. FINDINGS: CT CHEST FINDINGS Cardiovascular: No acute findings. Mediastinum/Lymph Nodes: No masses or pathologically enlarged lymph nodes identified. Lungs/Pleura: No pulmonary infiltrate or mass identified. No effusion present. Musculoskeletal:  No suspicious bone lesions identified. CT ABDOMEN AND PELVIS FINDINGS Hepatobiliary: No masses identified. Gallbladder is unremarkable. Pancreas:  No mass or inflammatory changes. Spleen:  Within normal limits in size and appearance. Adrenals/Urinary tract:  No masses or hydronephrosis. Stomach/Bowel: Moderate to large hiatal hernia. No evidence of obstruction, inflammatory process, or abnormal fluid collections. Diverticulosis is seen mainly involving the sigmoid colon, however there is no evidence of diverticulitis.  Vascular/Lymphatic: No pathologically enlarged lymph nodes identified. An aortocaval lymph node measuring 8 mm is noted image 69/2. No abdominal aortic aneurysm. Reproductive: Expected postop changes from hysterectomy. No pelvic mass or abnormal fluid collections identified. Other:  None. Musculoskeletal: No suspicious bone lesions identified. Old benign appearing fracture deformities seen involving the right superior and inferior pubic rami. IMPRESSION: 1. Single indeterminate 8 mm aorto-caval retroperitoneal lymph node. No other signs metastatic disease within the chest, abdomen, or pelvis. 2. Moderate to large hiatal hernia. 3. Colonic diverticulosis. No radiographic evidence of diverticulitis. Electronically Signed   By: JoMarlaine Hind.D.   On: 08/15/2018 08:39    I spent 55 minutes counseling the patient face to face.  The total time spent in the appointment was 60 minutes and more than 50% was on counseling.  All questions were answered. The patient knows to call the clinic with any problems, questions or concerns.  Heath Lark, MD 08/15/2018 11:26 AM

## 2018-08-15 NOTE — Assessment & Plan Note (Addendum)
I have reviewed imaging study with the patient's We discussed the role of adjuvant treatment  We reviewed the NCCN guidelines We discussed the role of chemotherapy. The intent is of curative intent.  We discussed some of the risks, benefits, side-effects of carboplatin & Taxol. Treatment is intravenous, every 3 weeks x 6 cycles  Some of the short term side-effects included, though not limited to, including weight loss, life threatening infections, risk of allergic reactions, need for transfusions of blood products, nausea, vomiting, change in bowel habits, loss of hair, admission to hospital for various reasons, and risks of death.   Long term side-effects are also discussed including risks of infertility, permanent damage to nerve function, hearing loss, chronic fatigue, kidney damage with possibility needing hemodialysis, and rare secondary malignancy including bone marrow disorders.  The patient is aware that the response rates discussed earlier is not guaranteed.  After a long discussion, patient made an informed decision to proceed with the prescribed plan of care.   Patient education material was dispensed. We discussed premedication with dexamethasone before chemotherapy. Due to her propensity for fluid retention, I will reduce oral premedication dexamethasone a little bit I do not plan prophylactic G-CSF support Due to high calculated BSA, I plan to reduce Taxol a little bit upfront I will try to get her scheduled for chemo education class, port placement and chemotherapy to start next week I will see her prior to cycle 2 of treatment We will get pathologist to add Her-2 testing on the serous component of her disease

## 2018-08-15 NOTE — Progress Notes (Signed)
START OFF PATHWAY REGIMEN - Uterine   [id number]:Carboplatin AUC=6 + Paclitaxel 175 mg/m2 q21 Days:   A cycle is every 21 days:     Paclitaxel      Carboplatin   **Always confirm dose/schedule in your pharmacy ordering system**  Patient Characteristics: Endometrioid Histology, Newly Diagnosed, Resected, Stage IB - Grade 3 Histology: Endometrioid Histology Therapeutic Status: Newly Diagnosed AJCC T Category: T1b AJCC N Category: N0 AJCC M Category: M0 AJCC 8 Stage Grouping: IB Surgical Status: Resected Intent of Therapy: Curative Intent, Discussed with Patient

## 2018-08-16 ENCOUNTER — Ambulatory Visit
Admission: RE | Admit: 2018-08-16 | Discharge: 2018-08-16 | Disposition: A | Discharge status: Home / Self Care | Payer: Medicare Other | Source: Ambulatory Visit | Attending: Radiation Oncology | Admitting: Radiation Oncology

## 2018-08-16 ENCOUNTER — Encounter: Payer: Self-pay | Admitting: Radiation Oncology

## 2018-08-16 ENCOUNTER — Telehealth: Payer: Self-pay | Admitting: Hematology and Oncology

## 2018-08-16 ENCOUNTER — Other Ambulatory Visit: Payer: Self-pay

## 2018-08-16 VITALS — BP 189/87 | HR 68 | Temp 98.7°F | Resp 18 | Ht 59.0 in | Wt 152.6 lb

## 2018-08-16 DIAGNOSIS — E509 Vitamin A deficiency, unspecified: Secondary | ICD-10-CM | POA: Diagnosis not present

## 2018-08-16 DIAGNOSIS — C549 Malignant neoplasm of corpus uteri, unspecified: Secondary | ICD-10-CM

## 2018-08-16 DIAGNOSIS — I272 Pulmonary hypertension, unspecified: Secondary | ICD-10-CM | POA: Diagnosis not present

## 2018-08-16 DIAGNOSIS — Z90722 Acquired absence of ovaries, bilateral: Secondary | ICD-10-CM | POA: Diagnosis not present

## 2018-08-16 DIAGNOSIS — G2581 Restless legs syndrome: Secondary | ICD-10-CM | POA: Diagnosis not present

## 2018-08-16 DIAGNOSIS — K449 Diaphragmatic hernia without obstruction or gangrene: Secondary | ICD-10-CM | POA: Insufficient documentation

## 2018-08-16 DIAGNOSIS — Z79899 Other long term (current) drug therapy: Secondary | ICD-10-CM | POA: Insufficient documentation

## 2018-08-16 DIAGNOSIS — F603 Borderline personality disorder: Secondary | ICD-10-CM | POA: Insufficient documentation

## 2018-08-16 DIAGNOSIS — I10 Essential (primary) hypertension: Secondary | ICD-10-CM | POA: Diagnosis not present

## 2018-08-16 DIAGNOSIS — Z86718 Personal history of other venous thrombosis and embolism: Secondary | ICD-10-CM | POA: Insufficient documentation

## 2018-08-16 DIAGNOSIS — F1320 Sedative, hypnotic or anxiolytic dependence, uncomplicated: Secondary | ICD-10-CM | POA: Insufficient documentation

## 2018-08-16 DIAGNOSIS — F419 Anxiety disorder, unspecified: Secondary | ICD-10-CM | POA: Diagnosis not present

## 2018-08-16 DIAGNOSIS — K5730 Diverticulosis of large intestine without perforation or abscess without bleeding: Secondary | ICD-10-CM | POA: Diagnosis not present

## 2018-08-16 DIAGNOSIS — Z90710 Acquired absence of both cervix and uterus: Secondary | ICD-10-CM | POA: Diagnosis not present

## 2018-08-16 DIAGNOSIS — C541 Malignant neoplasm of endometrium: Secondary | ICD-10-CM | POA: Insufficient documentation

## 2018-08-16 NOTE — Patient Instructions (Signed)
Coronavirus (COVID-19) Are you at risk?  Are you at risk for the Coronavirus (COVID-19)?  To be considered HIGH RISK for Coronavirus (COVID-19), you have to meet the following criteria:  . Traveled to [place], [place], [place], [place] or [place]; or in the [place] to [place], [place], [place], or [place]; and have fever, cough, and shortness of breath within the last 2 weeks of travel OR . Been in close contact with a person diagnosed with COVID-19 within the last 2 weeks and have fever, cough, and shortness of breath . IF YOU DO NOT MEET THESE CRITERIA, YOU ARE CONSIDERED LOW RISK FOR COVID-19.  What to do if you are HIGH RISK for COVID-19?  . If you are having a medical emergency, call 911. . Seek medical care right away. Before you go to a doctor's office, urgent care or emergency department, call ahead and tell them about your recent travel, contact with someone diagnosed with COVID-19, and your symptoms. You should receive instructions from your physician's office regarding next steps of care.  . When you arrive at healthcare provider, tell the healthcare staff immediately you have returned from visiting [place], [place], [place], [place] or [place]; or traveled in the [place] to [place], [place], [place], or [place]; in the last two weeks or you have been in close contact with a person diagnosed with COVID-19 in the last 2 weeks.   . Tell the health care staff about your symptoms: fever, cough and shortness of breath. . After you have been seen by a medical provider, you will be either: o Tested for (COVID-19) and discharged home on quarantine except to seek medical care if symptoms worsen, and asked to  - Stay home and avoid contact with others until you get your results (4-5 days)  - Avoid travel on public transportation if possible (such as bus, train, or airplane) or o Sent to the Emergency Department by EMS for evaluation, COVID-19 testing, and possible  admission depending on your condition and test results.  What to do if you are LOW RISK for COVID-19?  Reduce your risk of any infection by using the same precautions used for avoiding the common cold or flu:  . Wash your hands often with soap and warm water for at least 20 seconds.  If soap and water are not readily available, use an alcohol-based hand sanitizer with at least 60% alcohol.  . If coughing or sneezing, cover your mouth and nose by coughing or sneezing into the elbow areas of your shirt or coat, into a tissue or into your sleeve (not your hands). . Avoid shaking hands with others and consider head nods or verbal greetings only. . Avoid touching your eyes, nose, or mouth with unwashed hands.  . Avoid close contact with people who are sick. . Avoid places or events with large numbers of people in one location, like concerts or sporting events. . Carefully consider travel plans you have or are making. . If you are planning any travel outside or inside the [place], visit the CDC's Travelers' Health webpage for the latest health notices. . If you have some symptoms but not all symptoms, continue to monitor at home and seek medical attention if your symptoms worsen. . If you are having a medical emergency, call 911.   ADDITIONAL HEALTHCARE OPTIONS FOR PATIENTS  [place] [facility] / e-Visit: [email or web address]         [facility]: [phone]  [place]   [facility]: [phone]                   [facility]: [phone]   

## 2018-08-16 NOTE — Telephone Encounter (Signed)
I talk with patient regarding schedule  

## 2018-08-16 NOTE — Progress Notes (Addendum)
Radiation Oncology         [phone] ________________________________  Initial Outpatient Consultation  Name: Christina Hickman MRN: [id number]  Date: 08/16/2018  DOB: [birth year]  CC:Christina Code, DO  Christina Code, DO   REFERRING PHYSICIAN: [name], DO  DIAGNOSIS: The encounter diagnosis was Malignant neoplasm of endometrium [facility]).    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 female who is presenting to the office today for evaluation of endometrial cancer. She presented with vaginal postmenopausal bleeding in March, 2020 which became heavier through May, 2020. She consulted with Dr. Denman George on 08/11/18 who noted "She saw Dr Adah Perl in [place] on 06/06/18 and a pap was performed which showed ASCUS. She also performed a TVUS which showed a uterus measuring 8.6x2.8x4.6cm with a thickened 3cm endometrum with multiple cystic areas throughout the endometrium. An endometrial biopsy was performed on 06/19/18 which showed a "benign tumor" (pathology report from 06/19/18 showed an endometrial polyp). She was set up for hysterectomy. On 07/17/18 she underwent an LAVH and right salpingo-oophorectomy. The left tube and ovary was adherent to the sigmoid colon and was not removed. The pathology revealed a mixed endometrioid and serous carcinoma of the endometrium (FIGO grade 3). It showed 26m of 114mmyometrial invasion (outer half), no LVSI. Lymph nodes were not examined. The cervical stroma and right tube and ovary were normal." Dr. RoDenman Georgeecommended 6 cycles of carboplatin paclitaxel chemotherapy with vaginal brachii therapy for high intermediate risk factor uterine cancer and referred her to medical oncology as well as radiation oncology. She would like to obtain HER-2 testing on the serous portion of her cancer and consider trastuzumab if positive. Pt also met with Dr. GoAlvy Bimler/14/20 who decided  on Taxol every 3 weeks x 6 cycles.   She had a CT of her chest, abdomen and pelvis with contrast on 08/15/18 which detected a single indeterminate 8 mm aorto-caval retroperitoneal lymph node. No other signs of metastatic disease within the chest, abdomen or pelvis. Also of note was a moderate to large hiatal hernia and colonic diverticulosis.   she reports associated minimal vaginal bleeding/discharge now and nausea just this morning and has resolved. she denies dysuria, hematuria, diarrhea, constipation, abdominal bloating, pain and any other symptoms.    PREVIOUS RADIATION THERAPY: No  PAST MEDICAL HISTORY:  has a past medical history of Anxiety, Benzodiazepine dependence ([place](11/10/2017), Borderline personality disorder (HCD[place](07/03/2016), History of deep vein thrombosis (09/08/2017), Hypertension, Iron deficiency (08/24/2017), Major depressive disorder (01/12/2016), Migraine (01/12/2016), Pulmonary hypertension (H[place](08/23/2017), Restless leg (03/07/2017), and Uterine cancer ([place]    PAST SURGICAL HISTORY: Past Surgical History:  Procedure Laterality Date   ABDOMINAL HYSTERECTOMY     CARDIAC CATHETERIZATION     KNEE ARTHROSCOPY Right     FAMILY HISTORY: family history includes Diabetes in her paternal uncle; Heart attack in her father and maternal uncle.  SOCIAL HISTORY:  reports that she has never smoked. She has never used smokeless tobacco. She reports previous alcohol use.  ALLERGIES: Cephalexin, Losartan, Shellfish-derived products, Sulfa antibiotics, Serotonin, Alendronate, Beef-derived products, Galactose, and Lisinopril  MEDICATIONS:  Current Outpatient Medications  Medication Sig Dispense Refill   acetaminophen (TYLENOL) 500 MG tablet Take 500 mg by mouth every 6 (six) hours as needed.     clonazePAM (KLONOPIN) 1 MG tablet 1 mg 2 (two) times daily.      EPINEPHrine 0.3 mg/0.3 mL IJ SOAJ  injection epinephrine 0.3 mg/0.3 mL injection, auto-injector  USE AS DIRECTED      Nebivolol HCl (BYSTOLIC) 20 MG TABS Bystolic 20 mg tablet  TAKE ONE TABLET BY MOUTH DAILY     nitroGLYCERIN (NITROSTAT) 0.4 MG SL tablet nitroglycerin 0.4 mg sublingual tablet     omeprazole (PRILOSEC) 40 MG capsule Take 40 mg by mouth daily.      dexamethasone (DECADRON) 4 MG tablet Take 2  tabs at the night before and 2 tab the morning of chemotherapy, every 3 weeks, by mouth (Patient not taking: Reported on 08/16/2018) 24 tablet 0   lidocaine-prilocaine (EMLA) cream Apply to affected area once (Patient not taking: Reported on 08/16/2018) 30 g 3   ondansetron (ZOFRAN) 8 MG tablet Take 1 tablet (8 mg total) by mouth every 8 (eight) hours as needed for refractory nausea / vomiting. (Patient not taking: Reported on 08/16/2018) 30 tablet 1   prochlorperazine (COMPAZINE) 10 MG tablet Take 1 tablet (10 mg total) by mouth every 6 (six) hours as needed (Nausea or vomiting). (Patient not taking: Reported on 08/16/2018) 30 tablet 1   No current facility-administered medications for this encounter.     REVIEW OF SYSTEMS:  A 10+ POINT REVIEW OF SYSTEMS WAS OBTAINED including neurology, dermatology, psychiatry, cardiac, respiratory, lymph, extremities, GI, GU, musculoskeletal, constitutional, reproductive, HEENT. All pertinent positives are noted in the HPI. All others are negative.    PHYSICAL EXAM:  height is _0  (1.499 m) and weight is 152 lb 9.6 oz (69.2 kg). Her oral temperature is 98.7 F (37.1 C). Her blood pressure is 189/87 (abnormal) and her pulse is 68. Her respiration is 18 and oxygen saturation is 100%.   General: Alert and oriented, in no acute distress HEENT: Head is normocephalic. Extraocular movements are intact. Oropharynx is clear. Neck: Neck is supple, no palpable cervical or supraclavicular lymphadenopathy. Heart: Regular in rate and rhythm with no murmurs, rubs, or gallops. Chest: Clear to auscultation bilaterally, with no rhonchi, wheezes, or rales. Abdomen: Soft,  nontender, nondistended, with no rigidity or guarding. Extremities: No cyanosis or edema. Lymphatics: see Neck Exam Skin: No concerning lesions. Musculoskeletal: symmetric strength and muscle tone throughout. Neurologic: Cranial nerves II through XII are grossly intact. No obvious focalities. Speech is fluent. Coordination is intact. Psychiatric: Judgment and insight are intact. Affect is appropriate.  Pelvic exam deferred in light of recent exam by Dr. Denman George, will be performed at the time of her vaginal brachytherapy  planning and treatment.   ECOG = 1    LABORATORY DATA:  No results found for: WBC, HGB, HCT, MCV, PLT, NEUTROABS Lab Results  Component Value Date   NA 145 08/11/2018   K 3.7 08/11/2018   CL 111 08/11/2018   CO2 23 08/11/2018   GLUCOSE 91 08/11/2018   CREATININE 0.68 08/11/2018   CALCIUM 8.4 (L) 08/11/2018      RADIOGRAPHY: Ct Chest W Contrast  Result Date: 08/15/2018 CLINICAL DATA:  Newly diagnosed endometrial carcinoma. Status hysterectomy 3 weeks ago. Staging. EXAM: CT CHEST, ABDOMEN, AND PELVIS WITH CONTRAST TECHNIQUE: Multidetector CT imaging of the chest, abdomen and pelvis was performed following the standard protocol during bolus administration of intravenous contrast. CONTRAST:  12m OMNIPAQUE IOHEXOL 300 MG/ML  SOLN COMPARISON:  None. FINDINGS: CT CHEST FINDINGS Cardiovascular: No acute findings. Mediastinum/Lymph Nodes: No masses or pathologically enlarged lymph nodes identified. Lungs/Pleura: No pulmonary infiltrate or mass identified. No effusion present. Musculoskeletal:  No suspicious bone lesions identified. CT ABDOMEN AND PELVIS FINDINGS Hepatobiliary: No  masses identified. Gallbladder is unremarkable. Pancreas:  No mass or inflammatory changes. Spleen:  Within normal limits in size and appearance. Adrenals/Urinary tract:  No masses or hydronephrosis. Stomach/Bowel: Moderate to large hiatal hernia. No evidence of obstruction, inflammatory process, or  abnormal fluid collections. Diverticulosis is seen mainly involving the sigmoid colon, however there is no evidence of diverticulitis. Vascular/Lymphatic: No pathologically enlarged lymph nodes identified. An aortocaval lymph node measuring 8 mm is noted image 69/2. No abdominal aortic aneurysm. Reproductive: Expected postop changes from hysterectomy. No pelvic mass or abnormal fluid collections identified. Other:  None. Musculoskeletal: No suspicious bone lesions identified. Old benign appearing fracture deformities seen involving the right superior and inferior pubic rami. IMPRESSION: 1. Single indeterminate 8 mm aorto-caval retroperitoneal lymph node. No other signs metastatic disease within the chest, abdomen, or pelvis. 2. Moderate to large hiatal hernia. 3. Colonic diverticulosis. No radiographic evidence of diverticulitis. Electronically Signed   By: Marlaine Hind M.D.   On: 08/15/2018 08:39   Ct Abdomen Pelvis W Contrast  Result Date: 08/15/2018 CLINICAL DATA:  Newly diagnosed endometrial carcinoma. Status hysterectomy 3 weeks ago. Staging. EXAM: CT CHEST, ABDOMEN, AND PELVIS WITH CONTRAST TECHNIQUE: Multidetector CT imaging of the chest, abdomen and pelvis was performed following the standard protocol during bolus administration of intravenous contrast. CONTRAST:  169m OMNIPAQUE IOHEXOL 300 MG/ML  SOLN COMPARISON:  None. FINDINGS: CT CHEST FINDINGS Cardiovascular: No acute findings. Mediastinum/Lymph Nodes: No masses or pathologically enlarged lymph nodes identified. Lungs/Pleura: No pulmonary infiltrate or mass identified. No effusion present. Musculoskeletal:  No suspicious bone lesions identified. CT ABDOMEN AND PELVIS FINDINGS Hepatobiliary: No masses identified. Gallbladder is unremarkable. Pancreas:  No mass or inflammatory changes. Spleen:  Within normal limits in size and appearance. Adrenals/Urinary tract:  No masses or hydronephrosis. Stomach/Bowel: Moderate to large hiatal hernia. No evidence  of obstruction, inflammatory process, or abnormal fluid collections. Diverticulosis is seen mainly involving the sigmoid colon, however there is no evidence of diverticulitis. Vascular/Lymphatic: No pathologically enlarged lymph nodes identified. An aortocaval lymph node measuring 8 mm is noted image 69/2. No abdominal aortic aneurysm. Reproductive: Expected postop changes from hysterectomy. No pelvic mass or abnormal fluid collections identified. Other:  None. Musculoskeletal: No suspicious bone lesions identified. Old benign appearing fracture deformities seen involving the right superior and inferior pubic rami. IMPRESSION: 1. Single indeterminate 8 mm aorto-caval retroperitoneal lymph node. No other signs metastatic disease within the chest, abdomen, or pelvis. 2. Moderate to large hiatal hernia. 3. Colonic diverticulosis. No radiographic evidence of diverticulitis. Electronically Signed   By: JMarlaine HindM.D.   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 Given these findings, the pt would be at risk for vaginal cuff recurrence and I would agree with Dr. RSerita Gritrecommendation for vaginal brachytherapy.   Today, I talked to the patient about the findings and work-up thus far.  We discussed the natural history of her cancer and general treatment, highlighting the role of radiotherapy (vaginal brachytherapy in the management.  We discussed the available radiation techniques, and focused on the details of logistics and delivery.  We reviewed the anticipated acute and late sequelae associated with radiation in this setting.  The patient was encouraged to ask questions that I answered to the best of my ability.  A patient consent form was  discussed and signed.  We retained a copy for our records.  The patient would like to proceed  with radiation and will be scheduled for CT simulation, planning and treatment at a later date.   PLAN: Pt will proceed with adjuvant chemotherapy in the near future and we will schedule her vaginal brachytherapy during the 2nd or 3rd cycle of her adjuvant chemo. Anticipate 5 HDR treatments directed at the vaginal cuff, using [id number] as the HDR source.     ------------------------------------------------  Blair Promise, PhD, MD  This document serves as a record of services personally performed by Gery Pray, MD. It was created on his behalf by Mary-Margaret Loma Messing, a trained medical scribe. The creation of this record is based on the scribe's personal observations and the provider's statements to them. This document has been checked and approved by the attending provider.

## 2018-08-20 ENCOUNTER — Other Ambulatory Visit: Payer: Self-pay | Admitting: Radiology

## 2018-08-21 ENCOUNTER — Other Ambulatory Visit: Payer: Self-pay | Admitting: Radiology

## 2018-08-21 ENCOUNTER — Institutional Professional Consult (permissible substitution): Payer: Medicare Other | Admitting: Radiation Oncology

## 2018-08-21 ENCOUNTER — Telehealth: Payer: Self-pay | Admitting: Oncology

## 2018-08-21 NOTE — Telephone Encounter (Signed)
Requested CMP to be drawn with labs for port with Tiffany in IR.

## 2018-08-22 ENCOUNTER — Other Ambulatory Visit: Payer: Self-pay | Admitting: Hematology and Oncology

## 2018-08-22 ENCOUNTER — Ambulatory Visit (HOSPITAL_COMMUNITY)
Admission: RE | Admit: 2018-08-22 | Discharge: 2018-08-22 | Disposition: A | Discharge status: Home / Self Care | Payer: Medicare Other | Source: Ambulatory Visit | Attending: Hematology and Oncology | Admitting: Hematology and Oncology

## 2018-08-22 ENCOUNTER — Telehealth: Payer: Self-pay | Admitting: Oncology

## 2018-08-22 ENCOUNTER — Encounter (HOSPITAL_COMMUNITY): Payer: Self-pay

## 2018-08-22 ENCOUNTER — Other Ambulatory Visit: Payer: Self-pay

## 2018-08-22 DIAGNOSIS — Z8249 Family history of ischemic heart disease and other diseases of the circulatory system: Secondary | ICD-10-CM | POA: Diagnosis not present

## 2018-08-22 DIAGNOSIS — Z881 Allergy status to other antibiotic agents status: Secondary | ICD-10-CM | POA: Diagnosis not present

## 2018-08-22 DIAGNOSIS — I272 Pulmonary hypertension, unspecified: Secondary | ICD-10-CM | POA: Diagnosis not present

## 2018-08-22 DIAGNOSIS — Z79899 Other long term (current) drug therapy: Secondary | ICD-10-CM | POA: Insufficient documentation

## 2018-08-22 DIAGNOSIS — Z882 Allergy status to sulfonamides status: Secondary | ICD-10-CM | POA: Insufficient documentation

## 2018-08-22 DIAGNOSIS — Z90710 Acquired absence of both cervix and uterus: Secondary | ICD-10-CM | POA: Insufficient documentation

## 2018-08-22 DIAGNOSIS — F419 Anxiety disorder, unspecified: Secondary | ICD-10-CM | POA: Diagnosis not present

## 2018-08-22 DIAGNOSIS — C549 Malignant neoplasm of corpus uteri, unspecified: Secondary | ICD-10-CM | POA: Insufficient documentation

## 2018-08-22 DIAGNOSIS — I10 Essential (primary) hypertension: Secondary | ICD-10-CM | POA: Diagnosis not present

## 2018-08-22 DIAGNOSIS — Z888 Allergy status to other drugs, medicaments and biological substances status: Secondary | ICD-10-CM | POA: Diagnosis not present

## 2018-08-22 DIAGNOSIS — G2581 Restless legs syndrome: Secondary | ICD-10-CM | POA: Insufficient documentation

## 2018-08-22 DIAGNOSIS — Z86718 Personal history of other venous thrombosis and embolism: Secondary | ICD-10-CM | POA: Insufficient documentation

## 2018-08-22 DIAGNOSIS — F329 Major depressive disorder, single episode, unspecified: Secondary | ICD-10-CM | POA: Diagnosis not present

## 2018-08-22 HISTORY — PX: IR IMAGING GUIDED PORT INSERTION: IMG5740

## 2018-08-22 LAB — CBC WITH DIFFERENTIAL/PLATELET
Abs Immature Granulocytes: 0.02 10*3/uL (ref 0.00–0.07)
Basophils Absolute: 0 10*3/uL (ref 0.0–0.1)
Basophils Relative: 1 %
Eosinophils Absolute: 0.2 10*3/uL (ref 0.0–0.5)
Eosinophils Relative: 4 %
HCT: 38.9 % (ref 36.0–46.0)
Hemoglobin: 11.5 g/dL — ABNORMAL LOW (ref 12.0–15.0)
Immature Granulocytes: 0 %
Lymphocytes Relative: 25 %
Lymphs Abs: 1.4 10*3/uL (ref 0.7–4.0)
MCH: 27.4 pg (ref 26.0–34.0)
MCHC: 29.6 g/dL — ABNORMAL LOW (ref 30.0–36.0)
MCV: 92.8 fL (ref 80.0–100.0)
Monocytes Absolute: 0.5 10*3/uL (ref 0.1–1.0)
Monocytes Relative: 8 %
Neutro Abs: 3.5 10*3/uL (ref 1.7–7.7)
Neutrophils Relative %: 62 %
Platelets: 210 10*3/uL (ref 150–400)
RBC: 4.19 MIL/uL (ref 3.87–5.11)
RDW: 14.6 % (ref 11.5–15.5)
WBC: 5.7 10*3/uL (ref 4.0–10.5)
nRBC: 0 % (ref 0.0–0.2)

## 2018-08-22 LAB — COMPREHENSIVE METABOLIC PANEL
ALT: 41 U/L (ref 0–44)
AST: 43 U/L — ABNORMAL HIGH (ref 15–41)
Albumin: 3.8 g/dL (ref 3.5–5.0)
Alkaline Phosphatase: 78 U/L (ref 38–126)
Anion gap: 11 (ref 5–15)
BUN: 12 mg/dL (ref 8–23)
CO2: 25 mmol/L (ref 22–32)
Calcium: 8.9 mg/dL (ref 8.9–10.3)
Chloride: 106 mmol/L (ref 98–111)
Creatinine, Ser: 0.55 mg/dL (ref 0.44–1.00)
GFR calc Af Amer: >60 mL/min (ref >60)
GFR calc non Af Amer: >60 mL/min (ref >60)
Glucose, Bld: 88 mg/dL (ref 70–99)
Potassium: 3.7 mmol/L (ref 3.5–5.1)
Sodium: 142 mmol/L (ref 135–145)
Total Bilirubin: 0.3 mg/dL (ref 0.3–1.2)
Total Protein: 7.2 g/dL (ref 6.5–8.1)

## 2018-08-22 LAB — PROTIME-INR
INR: 0.9 (ref 0.8–1.2)
Prothrombin Time: 12.1 seconds (ref 11.4–15.2)

## 2018-08-22 MED ORDER — SODIUM CHLORIDE 0.9 % IV SOLN
INTRAVENOUS | Status: DC
  Administered 2018-08-22: 08:00:00 via INTRAVENOUS

## 2018-08-22 MED ORDER — FENTANYL CITRATE (PF) 100 MCG/2ML IJ SOLN
INTRAMUSCULAR | Status: AC | PRN
  Administered 2018-08-22: 50 ug via INTRAVENOUS

## 2018-08-22 MED ORDER — LIDOCAINE HCL (PF) 1 % IJ SOLN
INTRAMUSCULAR | Status: AC | PRN
  Administered 2018-08-22 (×2): 10 mL

## 2018-08-22 MED ORDER — FENTANYL CITRATE (PF) 100 MCG/2ML IJ SOLN
INTRAMUSCULAR | Status: AC
  Filled 2018-08-22: qty 2

## 2018-08-22 MED ORDER — LIDOCAINE HCL 1 % IJ SOLN
INTRAMUSCULAR | Status: AC
  Filled 2018-08-22: qty 20

## 2018-08-22 MED ORDER — HEPARIN SOD (PORK) LOCK FLUSH 100 UNIT/ML IV SOLN
INTRAVENOUS | Status: AC | PRN
  Administered 2018-08-22: 500 [IU] via INTRAVENOUS

## 2018-08-22 MED ORDER — CLINDAMYCIN PHOSPHATE 900 MG/50ML IV SOLN
900.0000 mg | Freq: Once | INTRAVENOUS | Status: AC
  Administered 2018-08-22: 900 mg via INTRAVENOUS

## 2018-08-22 MED ORDER — MIDAZOLAM HCL 2 MG/2ML IJ SOLN
INTRAMUSCULAR | Status: AC
  Filled 2018-08-22: qty 4

## 2018-08-22 MED ORDER — HEPARIN SOD (PORK) LOCK FLUSH 100 UNIT/ML IV SOLN
INTRAVENOUS | Status: AC
  Filled 2018-08-22: qty 5

## 2018-08-22 MED ORDER — CLINDAMYCIN PHOSPHATE 900 MG/50ML IV SOLN
INTRAVENOUS | Status: AC
  Administered 2018-08-22: 900 mg via INTRAVENOUS
  Filled 2018-08-22: qty 50

## 2018-08-22 MED ORDER — MIDAZOLAM HCL 2 MG/2ML IJ SOLN
INTRAMUSCULAR | Status: AC | PRN
  Administered 2018-08-22 (×2): 1 mg via INTRAVENOUS

## 2018-08-22 MED ORDER — LIDOCAINE-EPINEPHRINE (PF) 2 %-1:200000 IJ SOLN
INTRAMUSCULAR | Status: AC
  Filled 2018-08-22: qty 20

## 2018-08-22 NOTE — Discharge Instructions (Addendum)
Do not use EMLA cream until your new port has healed. The cancer center nurses will tell you when it is healed.    Implanted [facility] Guide An implanted port is a device that is placed under the skin. It is usually placed in the chest. The device can be used to give IV medicine, to take blood, or for dialysis. You may have an implanted port if:  You need IV medicine that would be irritating to the small veins in your hands or arms.  You need IV medicines, such as antibiotics, for a long period of time.  You need IV nutrition for a long period of time.  You need dialysis. Having a port means that your health care provider will not need to use the veins in your arms for these procedures. You may have fewer limitations when using a port than you would if you used other types of long-term IVs, and you will likely be able to return to normal activities after your incision heals. An implanted port has two main parts:  Reservoir. The reservoir is the part where a needle is inserted to give medicines or draw blood. The reservoir is round. After it is placed, it appears as a small, raised area under your skin.  Catheter. The catheter is a thin, flexible tube that connects the reservoir to a vein. Medicine that is inserted into the reservoir goes into the catheter and then into the vein. How is my port accessed? To access your port:  A numbing cream may be placed on the skin over the port site.  Your health care provider will put on a mask and sterile gloves.  The skin over your port will be cleaned carefully with a germ-killing soap and allowed to dry.  Your health care provider will gently pinch the port and insert a needle into it.  Your health care provider will check for a blood return to make sure the port is in the vein and is not clogged.  If your port needs to remain accessed to get medicine continuously (constant infusion), your health care provider will place a clear bandage  (dressing) over the needle site. The dressing and needle will need to be changed every week, or as told by your health care provider. What is flushing? Flushing helps keep the port from getting clogged. Follow instructions from your health care provider about how and when to flush the port. Ports are usually flushed with saline solution or a medicine called heparin. The need for flushing will depend on how the port is used:  If the port is only used from time to time to give medicines or draw blood, the port may need to be flushed: ? Before and after medicines have been given. ? Before and after blood has been drawn. ? As part of routine maintenance. Flushing may be recommended every 4-6 weeks.  If a constant infusion is running, the port may not need to be flushed.  Throw away any syringes in a disposal container that is meant for sharp items (sharps container). You can buy a sharps container from a pharmacy, or you can make one by using an empty hard plastic bottle with a cover. How long will my port stay implanted? The port can stay in for as long as your health care provider thinks it is needed. When it is time for the port to come out, a surgery will be done to remove it. The surgery will be similar to the procedure  that was done to put the port in. Follow these instructions at home:   Flush your port as told by your health care provider.  If you need an infusion over several days, follow instructions from your health care provider about how to take care of your port site. Make sure you: ? Wash your hands with soap and water before you change your dressing. If soap and water are not available, use alcohol-based hand sanitizer. ? Change your dressing as told by your health care provider. ? Place any used dressings or infusion bags into a plastic bag. Throw that bag in the trash. ? Keep the dressing that covers the needle clean and dry. Do not get it wet. ? Do not use scissors or sharp  objects near the tube. ? Keep the tube clamped, unless it is being used.  Check your port site every day for signs of infection. Check for: ? Redness, swelling, or pain. ? Fluid or blood. ? Pus or a bad smell.  Protect the skin around the port site. ? Avoid wearing bra straps that rub or irritate the site. ? Protect the skin around your port from seat belts. Place a soft pad over your chest if needed.  Bathe or shower as told by your health care provider. The site may get wet as long as you are not actively receiving an infusion.  Return to your normal activities as told by your health care provider. Ask your health care provider what activities are safe for you.  Carry a medical alert card or wear a medical alert bracelet at all times. This will let health care providers know that you have an implanted port in case of an emergency. Get help right away if:  You have redness, swelling, or pain at the port site.  You have fluid or blood coming from your port site.  You have pus or a bad smell coming from the port site.  You have a fever. Summary  Implanted ports are usually placed in the chest for long-term IV access.  Follow instructions from your health care provider about flushing the port and changing bandages (dressings).  Take care of the area around your port by avoiding clothing that puts pressure on the area, and by watching for signs of infection.  Protect the skin around your port from seat belts. Place a soft pad over your chest if needed.  Get help right away if you have a fever or you have redness, swelling, pain, drainage, or a bad smell at the port site. This information is not intended to replace advice given to you by your health care provider. Make sure you discuss any questions you have with your health care provider. Document Released: 01/18/2005 Document Revised: 05/12/2018 Document Reviewed: 02/21/2016 Elsevier Patient Education  [place].    Moderate Conscious Sedation, Adult, Care After These instructions provide you with information about caring for yourself after your procedure. Your health care provider may also give you more specific instructions. Your treatment has been planned according to current medical practices, but problems sometimes occur. Call your health care provider if you have any problems or questions after your procedure. What can I expect after the procedure? After your procedure, it is common:  To feel sleepy for several hours.  To feel clumsy and have poor balance for several hours.  To have poor judgment for several hours.  To vomit if you eat too soon. Follow these instructions at home: For at least  24 hours after the procedure:   Do not: ? Participate in activities where you could fall or become injured. ? Drive. ? Use heavy machinery. ? Drink alcohol. ? Take sleeping pills or medicines that cause drowsiness. ? Make important decisions or sign legal documents. ? Take care of children on your own.  Rest. Eating and drinking  Follow the diet recommended by your health care provider.  If you vomit: ? Drink water, juice, or soup when you can drink without vomiting. ? Make sure you have little or no nausea before eating solid foods. General instructions  Have a responsible adult stay with you until you are awake and alert.  Take over-the-counter and prescription medicines only as told by your health care provider.  If you smoke, do not smoke without supervision.  Keep all follow-up visits as told by your health care provider. This is important. Contact a health care provider if:  You keep feeling nauseous or you keep vomiting.  You feel light-headed.  You develop a rash.  You have a fever. Get help right away if:  You have trouble breathing. This information is not intended to replace advice given to you by your health care provider. Make sure you discuss any  questions you have with your health care provider. Document Released: 11/08/2012 Document Revised: 12/31/2016 Document Reviewed: 05/10/2015 Elsevier Patient Education  2020 [employer].

## 2018-08-22 NOTE — Consult Note (Signed)
Chief Complaint: Patient was seen in consultation today for Port-A-Cath placement  Referring Physician(s): Barry  Supervising Physician: Corrie Mckusick  Patient Status: [facility] - Out-pt  History of Present Illness: Christina Hickman is a 68 y.o. female with history of recently diagnosed uterine/endometrial cancer, status post surgery who presents today for Port-A-Cath placement for chemotherapy.  Past Medical History:  Diagnosis Date   Anxiety    Benzodiazepine dependence ([place]) 11/10/2017   Borderline personality disorder ([place]) 07/03/2016   History of deep vein thrombosis 09/08/2017   Hypertension    Iron deficiency 08/24/2017   Major depressive disorder 01/12/2016   Migraine 01/12/2016   Pulmonary hypertension ([place]) 08/23/2017   Restless leg 03/07/2017   Uterine cancer ([place])     Past Surgical History:  Procedure Laterality Date   ABDOMINAL HYSTERECTOMY     CARDIAC CATHETERIZATION     KNEE ARTHROSCOPY Right     Allergies: Cephalexin, Losartan, Shellfish-derived products, Sulfa antibiotics, Serotonin, Alendronate, Beef-derived products, Galactose, and Lisinopril  Medications: Prior to Admission medications   Medication Sig Start Date End Date Taking? Authorizing Provider  acetaminophen (TYLENOL) 500 MG tablet Take 500 mg by mouth every 6 (six) hours as needed.    [provider]  clonazePAM (KLONOPIN) 1 MG tablet 1 mg 2 (two) times daily.  08/02/18   [provider]  dexamethasone (DECADRON) 4 MG tablet Take 2  tabs at the night before and 2 tab the morning of chemotherapy, every 3 weeks, by mouth Patient not taking: Reported on 08/16/2018 08/15/18   Heath Lark, MD  EPINEPHrine 0.3 mg/0.3 mL IJ SOAJ injection epinephrine 0.3 mg/0.3 mL injection, auto-injector  USE AS DIRECTED    [provider]  lidocaine-prilocaine (EMLA) cream Apply to affected area once Patient not taking: Reported on 08/16/2018 08/15/18   Heath Lark, MD    Nebivolol HCl (BYSTOLIC) 20 MG TABS Bystolic 20 mg tablet  TAKE ONE TABLET BY MOUTH DAILY    [provider]  nitroGLYCERIN (NITROSTAT) 0.4 MG SL tablet nitroglycerin 0.4 mg sublingual tablet    [provider]  omeprazole (PRILOSEC) 40 MG capsule Take 40 mg by mouth daily.  08/03/18   [provider]  ondansetron (ZOFRAN) 8 MG tablet Take 1 tablet (8 mg total) by mouth every 8 (eight) hours as needed for refractory nausea / vomiting. Patient not taking: Reported on 08/16/2018 08/15/18   Heath Lark, MD  prochlorperazine (COMPAZINE) 10 MG tablet Take 1 tablet (10 mg total) by mouth every 6 (six) hours as needed (Nausea or vomiting). Patient not taking: Reported on 08/16/2018 08/15/18   Heath Lark, MD     Family History  Problem Relation Age of Onset   Heart attack Father    Heart attack Maternal Uncle    Diabetes Paternal Uncle     Social History   Socioeconomic History   Marital status: Married    Spouse name: Legrand Como    Number of children: 1   Years of education: Not on file   Highest education level: Not on file  Occupational History   Occupation: farmer  Scientist, product/process development strain: Not on file   Food insecurity    Worry: Not on file    Inability: Not on Lexicographer needs    Medical: Not on file    Non-medical: Not on file  Tobacco Use   Smoking status: Never Smoker   Smokeless tobacco: Never Used  Substance and Sexual Activity   Alcohol use: Not  Currently    Comment: former use   Drug use: Not on file   Sexual activity: Not Currently  Lifestyle   Physical activity    Days per week: Not on file    Minutes per session: Not on file   Stress: Not on file  Relationships   Social connections    Talks on phone: Not on file    Gets together: Not on file    Attends religious service: Not on file    Active member of club or organization: Not on file    Attends meetings of clubs or organizations: Not on  file    Relationship status: Not on file  Other Topics Concern   Not on file  Social History Narrative   Not on file      Review of Systems denies fever, chest pain, dyspnea, cough, abdominal pain, vomiting or abnormal bleeding.  She does have occasional headaches, low back pain and intermittent nausea.  Vital Signs: BP (!) 159/95    Pulse (!) 57    Temp 97.8 F (36.6 C) (Oral)    Resp 16    SpO2 98%   Physical Exam awake, alert.  Chest clear to auscultation bilaterally.  Heart with slightly bradycardic but regular rhythm.  Abdomen soft, positive bowel sounds, nontender.  Trace pretibial edema bilaterally.  Imaging: Ct Chest W Contrast  Result Date: 08/15/2018 CLINICAL DATA:  Newly diagnosed endometrial carcinoma. Status hysterectomy 3 weeks ago. Staging. EXAM: CT CHEST, ABDOMEN, AND PELVIS WITH CONTRAST TECHNIQUE: Multidetector CT imaging of the chest, abdomen and pelvis was performed following the standard protocol during bolus administration of intravenous contrast. CONTRAST:  195mL OMNIPAQUE IOHEXOL 300 MG/ML  SOLN COMPARISON:  None. FINDINGS: CT CHEST FINDINGS Cardiovascular: No acute findings. Mediastinum/Lymph Nodes: No masses or pathologically enlarged lymph nodes identified. Lungs/Pleura: No pulmonary infiltrate or mass identified. No effusion present. Musculoskeletal:  No suspicious bone lesions identified. CT ABDOMEN AND PELVIS FINDINGS Hepatobiliary: No masses identified. Gallbladder is unremarkable. Pancreas:  No mass or inflammatory changes. Spleen:  Within normal limits in size and appearance. Adrenals/Urinary tract:  No masses or hydronephrosis. Stomach/Bowel: Moderate to large hiatal hernia. No evidence of obstruction, inflammatory process, or abnormal fluid collections. Diverticulosis is seen mainly involving the sigmoid colon, however there is no evidence of diverticulitis. Vascular/Lymphatic: No pathologically enlarged lymph nodes identified. An aortocaval lymph node  measuring 8 mm is noted image 69/2. No abdominal aortic aneurysm. Reproductive: Expected postop changes from hysterectomy. No pelvic mass or abnormal fluid collections identified. Other:  None. Musculoskeletal: No suspicious bone lesions identified. Old benign appearing fracture deformities seen involving the right superior and inferior pubic rami. IMPRESSION: 1. Single indeterminate 8 mm aorto-caval retroperitoneal lymph node. No other signs metastatic disease within the chest, abdomen, or pelvis. 2. Moderate to large hiatal hernia. 3. Colonic diverticulosis. No radiographic evidence of diverticulitis. Electronically Signed   By: Marlaine Hind M.D.   On: 08/15/2018 08:39   Ct Abdomen Pelvis W Contrast  Result Date: 08/15/2018 CLINICAL DATA:  Newly diagnosed endometrial carcinoma. Status hysterectomy 3 weeks ago. Staging. EXAM: CT CHEST, ABDOMEN, AND PELVIS WITH CONTRAST TECHNIQUE: Multidetector CT imaging of the chest, abdomen and pelvis was performed following the standard protocol during bolus administration of intravenous contrast. CONTRAST:  126mL OMNIPAQUE IOHEXOL 300 MG/ML  SOLN COMPARISON:  None. FINDINGS: CT CHEST FINDINGS Cardiovascular: No acute findings. Mediastinum/Lymph Nodes: No masses or pathologically enlarged lymph nodes identified. Lungs/Pleura: No pulmonary infiltrate or mass identified. No effusion present. Musculoskeletal:  No  suspicious bone lesions identified. CT ABDOMEN AND PELVIS FINDINGS Hepatobiliary: No masses identified. Gallbladder is unremarkable. Pancreas:  No mass or inflammatory changes. Spleen:  Within normal limits in size and appearance. Adrenals/Urinary tract:  No masses or hydronephrosis. Stomach/Bowel: Moderate to large hiatal hernia. No evidence of obstruction, inflammatory process, or abnormal fluid collections. Diverticulosis is seen mainly involving the sigmoid colon, however there is no evidence of diverticulitis. Vascular/Lymphatic: No pathologically enlarged lymph  nodes identified. An aortocaval lymph node measuring 8 mm is noted image 69/2. No abdominal aortic aneurysm. Reproductive: Expected postop changes from hysterectomy. No pelvic mass or abnormal fluid collections identified. Other:  None. Musculoskeletal: No suspicious bone lesions identified. Old benign appearing fracture deformities seen involving the right superior and inferior pubic rami. IMPRESSION: 1. Single indeterminate 8 mm aorto-caval retroperitoneal lymph node. No other signs metastatic disease within the chest, abdomen, or pelvis. 2. Moderate to large hiatal hernia. 3. Colonic diverticulosis. No radiographic evidence of diverticulitis. Electronically Signed   By: Marlaine Hind M.D.   On: 08/15/2018 08:39    Labs:  CBC: No results for input(s): WBC, HGB, HCT, PLT in the last 8760 hours.  COAGS: No results for input(s): INR, APTT in the last 8760 hours.  BMP: Recent Labs    08/11/18 1132  NA 145  K 3.7  CL 111  CO2 23  GLUCOSE 91  BUN 12  CALCIUM 8.4*  CREATININE 0.68  GFRNONAA >60  GFRAA >60    LIVER FUNCTION TESTS: No results for input(s): BILITOT, AST, ALT, ALKPHOS, PROT, ALBUMIN in the last 8760 hours.  TUMOR MARKERS: No results for input(s): AFPTM, CEA, CA199, CHROMGRNA in the last 8760 hours.  Assessment and Plan: 68 y.o. female with history of recently diagnosed uterine/endometrial cancer, status post surgery who presents today for Port-A-Cath placement for chemotherapy.Risks and benefits of image guided port-a-catheter placement was discussed with the patient including, but not limited to bleeding, infection, pneumothorax, or fibrin sheath development and need for additional procedures.  All of the patient's questions were answered, patient is agreeable to proceed. Consent signed and in chart.     Thank you for this interesting consult.  I greatly enjoyed meeting Sherryl Valido and look forward to participating in their care.  A copy of this report was sent to  the requesting provider on this date.  Electronically Signed: D. Rowe Robert, PA-C 08/22/2018, 8:36 AM   I spent a total of 25 minutes    in face to face in clinical consultation, greater than 50% of which was counseling/coordinating care for port a cath placement

## 2018-08-22 NOTE — Telephone Encounter (Signed)
Called Christina Hickman and advised her the lab appointment for tomorrow has been canceled so she will need to be at the [facility] for her 8:45 appointment with Dr. Alvy Bimler.  She verbalized understanding and agreement.

## 2018-08-22 NOTE — Procedures (Signed)
Interventional Radiology Procedure Note  Procedure: Placement of a right IJ approach single lumen PowerPort.  Tip is positioned at the superior cavoatrial junction and catheter is ready for immediate use.  Complications: None Recommendations:  - Ok to shower tomorrow - Do not submerge for 7 days - Routine line care   Signed,  Stevi Hollinshead S. Perfecto Purdy, DO   

## 2018-08-23 ENCOUNTER — Other Ambulatory Visit: Payer: Self-pay | Admitting: Hematology and Oncology

## 2018-08-23 ENCOUNTER — Other Ambulatory Visit: Payer: Self-pay

## 2018-08-23 ENCOUNTER — Inpatient Hospital Stay (HOSPITAL_BASED_OUTPATIENT_CLINIC_OR_DEPARTMENT_OTHER): Payer: Medicare Other | Admitting: Medical

## 2018-08-23 ENCOUNTER — Other Ambulatory Visit: Payer: Medicare Other

## 2018-08-23 ENCOUNTER — Inpatient Hospital Stay: Payer: Medicare Other

## 2018-08-23 ENCOUNTER — Inpatient Hospital Stay (HOSPITAL_BASED_OUTPATIENT_CLINIC_OR_DEPARTMENT_OTHER): Payer: Medicare Other | Admitting: Hematology and Oncology

## 2018-08-23 VITALS — BP 182/88 | HR 66 | Temp 98.5°F | Resp 20

## 2018-08-23 DIAGNOSIS — C549 Malignant neoplasm of corpus uteri, unspecified: Secondary | ICD-10-CM

## 2018-08-23 DIAGNOSIS — C55 Malignant neoplasm of uterus, part unspecified: Secondary | ICD-10-CM | POA: Diagnosis not present

## 2018-08-23 DIAGNOSIS — C541 Malignant neoplasm of endometrium: Secondary | ICD-10-CM

## 2018-08-23 DIAGNOSIS — T8090XA Unspecified complication following infusion and therapeutic injection, initial encounter: Secondary | ICD-10-CM

## 2018-08-23 DIAGNOSIS — C482 Malignant neoplasm of peritoneum, unspecified: Secondary | ICD-10-CM | POA: Diagnosis not present

## 2018-08-23 MED ORDER — PALONOSETRON HCL INJECTION 0.25 MG/5ML
INTRAVENOUS | Status: AC
  Filled 2018-08-23: qty 5

## 2018-08-23 MED ORDER — DIPHENHYDRAMINE HCL 50 MG/ML IJ SOLN
INTRAMUSCULAR | Status: AC
  Filled 2018-08-23: qty 1

## 2018-08-23 MED ORDER — SODIUM CHLORIDE 0.9 % IV SOLN
140.0000 mg/m2 | Freq: Once | INTRAVENOUS | Status: AC
  Administered 2018-08-23: 234 mg via INTRAVENOUS
  Filled 2018-08-23: qty 39

## 2018-08-23 MED ORDER — FAMOTIDINE IN NACL 20-0.9 MG/50ML-% IV SOLN
20.0000 mg | Freq: Once | INTRAVENOUS | Status: AC
  Administered 2018-08-23: 20 mg via INTRAVENOUS

## 2018-08-23 MED ORDER — SODIUM CHLORIDE 0.9 % IV SOLN
Freq: Once | INTRAVENOUS | Status: AC
  Administered 2018-08-23: 10:00:00 via INTRAVENOUS
  Filled 2018-08-23: qty 250

## 2018-08-23 MED ORDER — HEPARIN SOD (PORK) LOCK FLUSH 100 UNIT/ML IV SOLN
500.0000 [IU] | Freq: Once | INTRAVENOUS | Status: AC | PRN
  Administered 2018-08-23: 17:00:00 500 [IU]
  Filled 2018-08-23: qty 5

## 2018-08-23 MED ORDER — SODIUM CHLORIDE 0.9% FLUSH
10.0000 mL | INTRAVENOUS | Status: DC | PRN
  Administered 2018-08-23: 17:00:00 10 mL
  Filled 2018-08-23: qty 10

## 2018-08-23 MED ORDER — FAMOTIDINE IN NACL 20-0.9 MG/50ML-% IV SOLN
INTRAVENOUS | Status: AC
  Filled 2018-08-23: qty 50

## 2018-08-23 MED ORDER — PALONOSETRON HCL INJECTION 0.25 MG/5ML
0.2500 mg | Freq: Once | INTRAVENOUS | Status: AC
  Administered 2018-08-23: 0.25 mg via INTRAVENOUS

## 2018-08-23 MED ORDER — DIPHENHYDRAMINE HCL 50 MG/ML IJ SOLN
50.0000 mg | Freq: Once | INTRAMUSCULAR | Status: AC
  Administered 2018-08-23: 50 mg via INTRAVENOUS

## 2018-08-23 MED ORDER — SODIUM CHLORIDE 0.9 % IV SOLN
Freq: Once | INTRAVENOUS | Status: AC
  Administered 2018-08-23: 11:00:00 via INTRAVENOUS
  Filled 2018-08-23: qty 5

## 2018-08-23 MED ORDER — FAMOTIDINE IN NACL 20-0.9 MG/50ML-% IV SOLN
20.0000 mg | Freq: Once | INTRAVENOUS | Status: AC | PRN
  Administered 2018-08-23: 20 mg via INTRAVENOUS

## 2018-08-23 MED ORDER — SODIUM CHLORIDE 0.9 % IV SOLN
494.4000 mg | Freq: Once | INTRAVENOUS | Status: AC
  Administered 2018-08-23: 490 mg via INTRAVENOUS
  Filled 2018-08-23: qty 49

## 2018-08-23 NOTE — Patient Instructions (Signed)
[facility] Discharge Instructions for Patients Receiving Chemotherapy  Today you received the following chemotherapy agents Paclitaxel (TAXOL) & Carboplatin (PARAPLATIN).  To help prevent nausea and vomiting after your treatment, we encourage you to take your nausea medication as prescribed.  If you develop nausea and vomiting that is not controlled by your nausea medication, call the clinic.   BELOW ARE SYMPTOMS THAT SHOULD BE REPORTED IMMEDIATELY:  *FEVER GREATER THAN 100.5 F  *CHILLS WITH OR WITHOUT FEVER  NAUSEA AND VOMITING THAT IS NOT CONTROLLED WITH YOUR NAUSEA MEDICATION  *UNUSUAL SHORTNESS OF BREATH  *UNUSUAL BRUISING OR BLEEDING  TENDERNESS IN MOUTH AND THROAT WITH OR WITHOUT PRESENCE OF ULCERS  *URINARY PROBLEMS  *BOWEL PROBLEMS  UNUSUAL RASH Items with * indicate a potential emergency and should be followed up as soon as possible.  Feel free to call the clinic should you have any questions or concerns. The clinic phone number is [phone].  Please show the CHEMO ALERT CARD at check-in to the Emergency Department and triage nurse.  Paclitaxel injection What is this medicine? PACLITAXEL (PAK li TAX el) is a chemotherapy drug. It targets fast dividing cells, like cancer cells, and causes these cells to die. This medicine is used to treat ovarian cancer, breast cancer, lung cancer, Kaposi's sarcoma, and other cancers. This medicine may be used for other purposes; ask your health care provider or pharmacist if you have questions. COMMON BRAND NAME(S): Onxol, Taxol What should I tell my health care provider before I take this medicine? They need to know if you have any of these conditions:  history of irregular heartbeat  liver disease  low blood counts, like low white cell, platelet, or red cell counts  lung or breathing disease, like asthma  tingling of the fingers or toes, or other nerve disorder  an unusual or allergic reaction to  paclitaxel, alcohol, polyoxyethylated castor oil, other chemotherapy, other medicines, foods, dyes, or preservatives  pregnant or trying to get pregnant  breast-feeding How should I use this medicine? This drug is given as an infusion into a vein. It is administered in a hospital or clinic by a specially trained health care professional. Talk to your pediatrician regarding the use of this medicine in children. Special care may be needed. Overdosage: If you think you have taken too much of this medicine contact a poison control center or emergency room at once. NOTE: This medicine is only for you. Do not share this medicine with others. What if I miss a dose? It is important not to miss your dose. Call your doctor or health care professional if you are unable to keep an appointment. What may interact with this medicine? Do not take this medicine with any of the following medications:  disulfiram  metronidazole This medicine may also interact with the following medications:  antiviral medicines for hepatitis, HIV or AIDS  certain antibiotics like erythromycin and clarithromycin  certain medicines for fungal infections like ketoconazole and itraconazole  certain medicines for seizures like carbamazepine, phenobarbital, phenytoin  gemfibrozil  nefazodone  rifampin  St. John's wort This list may not describe all possible interactions. Give your health care provider a list of all the medicines, herbs, non-prescription drugs, or dietary supplements you use. Also tell them if you smoke, drink alcohol, or use illegal drugs. Some items may interact with your medicine. What should I watch for while using this medicine? Your condition will be monitored carefully while you are receiving this medicine. You will need   important blood work done while you are taking this medicine. This medicine can cause serious allergic reactions. To reduce your risk you will need to take other medicine(s)  before treatment with this medicine. If you experience allergic reactions like skin rash, itching or hives, swelling of the face, lips, or tongue, tell your doctor or health care professional right away. In some cases, you may be given additional medicines to help with side effects. Follow all directions for their use. This drug may make you feel generally unwell. This is not uncommon, as chemotherapy can affect healthy cells as well as cancer cells. Report any side effects. Continue your course of treatment even though you feel ill unless your doctor tells you to stop. Call your doctor or health care professional for advice if you get a fever, chills or sore throat, or other symptoms of a cold or flu. Do not treat yourself. This drug decreases your body's ability to fight infections. Try to avoid being around people who are sick. This medicine may increase your risk to bruise or bleed. Call your doctor or health care professional if you notice any unusual bleeding. Be careful brushing and flossing your teeth or using a toothpick because you may get an infection or bleed more easily. If you have any dental work done, tell your dentist you are receiving this medicine. Avoid taking products that contain aspirin, acetaminophen, ibuprofen, naproxen, or ketoprofen unless instructed by your doctor. These medicines may hide a fever. Do not become pregnant while taking this medicine. Women should inform their doctor if they wish to become pregnant or think they might be pregnant. There is a potential for serious side effects to an unborn child. Talk to your health care professional or pharmacist for more information. Do not breast-feed an infant while taking this medicine. Men are advised not to father a child while receiving this medicine. This product may contain alcohol. Ask your pharmacist or healthcare provider if this medicine contains alcohol. Be sure to tell all healthcare providers you are taking this  medicine. Certain medicines, like metronidazole and disulfiram, can cause an unpleasant reaction when taken with alcohol. The reaction includes flushing, headache, nausea, vomiting, sweating, and increased thirst. The reaction can last from 30 minutes to several hours. What side effects may I notice from receiving this medicine? Side effects that you should report to your doctor or health care professional as soon as possible:  allergic reactions like skin rash, itching or hives, swelling of the face, lips, or tongue  breathing problems  changes in vision  fast, irregular heartbeat  high or low blood pressure  mouth sores  pain, tingling, numbness in the hands or feet  signs of decreased platelets or bleeding - bruising, pinpoint red spots on the skin, black, tarry stools, blood in the urine  signs of decreased red blood cells - unusually weak or tired, feeling faint or lightheaded, falls  signs of infection - fever or chills, cough, sore throat, pain or difficulty passing urine  signs and symptoms of liver injury like dark yellow or brown urine; general ill feeling or flu-like symptoms; light-colored stools; loss of appetite; nausea; right upper belly pain; unusually weak or tired; yellowing of the eyes or skin  swelling of the ankles, feet, hands  unusually slow heartbeat Side effects that usually do not require medical attention (report to your doctor or health care professional if they continue or are bothersome):  diarrhea  hair loss  loss of appetite  muscle or   joint pain  nausea, vomiting  pain, redness, or irritation at site where injected  tiredness This list may not describe all possible side effects. Call your doctor for medical advice about side effects. You may report side effects to FDA at 1-800-FDA-1088. Where should I keep my medicine? This drug is given in a hospital or clinic and will not be stored at home. NOTE: This sheet is a summary. It may not  cover all possible information. If you have questions about this medicine, talk to your doctor, pharmacist, or health care provider.  2020 Elsevier/Gold Standard (2016-09-21 13:14:55)  Carboplatin injection What is this medicine? CARBOPLATIN (KAR boe pla tin) is a chemotherapy drug. It targets fast dividing cells, like cancer cells, and causes these cells to die. This medicine is used to treat ovarian cancer and many other cancers. This medicine may be used for other purposes; ask your health care provider or pharmacist if you have questions. COMMON BRAND NAME(S): Paraplatin What should I tell my health care provider before I take this medicine? They need to know if you have any of these conditions:  blood disorders  hearing problems  kidney disease  recent or ongoing radiation therapy  an unusual or allergic reaction to carboplatin, cisplatin, other chemotherapy, other medicines, foods, dyes, or preservatives  pregnant or trying to get pregnant  breast-feeding How should I use this medicine? This drug is usually given as an infusion into a vein. It is administered in a hospital or clinic by a specially trained health care professional. Talk to your pediatrician regarding the use of this medicine in children. Special care may be needed. Overdosage: If you think you have taken too much of this medicine contact a poison control center or emergency room at once. NOTE: This medicine is only for you. Do not share this medicine with others. What if I miss a dose? It is important not to miss a dose. Call your doctor or health care professional if you are unable to keep an appointment. What may interact with this medicine?  medicines for seizures  medicines to increase blood counts like filgrastim, pegfilgrastim, sargramostim  some antibiotics like amikacin, gentamicin, neomycin, streptomycin, tobramycin  vaccines Talk to your doctor or health care professional before taking any of  these medicines:  acetaminophen  aspirin  ibuprofen  ketoprofen  naproxen This list may not describe all possible interactions. Give your health care provider a list of all the medicines, herbs, non-prescription drugs, or dietary supplements you use. Also tell them if you smoke, drink alcohol, or use illegal drugs. Some items may interact with your medicine. What should I watch for while using this medicine? Your condition will be monitored carefully while you are receiving this medicine. You will need important blood work done while you are taking this medicine. This drug may make you feel generally unwell. This is not uncommon, as chemotherapy can affect healthy cells as well as cancer cells. Report any side effects. Continue your course of treatment even though you feel ill unless your doctor tells you to stop. In some cases, you may be given additional medicines to help with side effects. Follow all directions for their use. Call your doctor or health care professional for advice if you get a fever, chills or sore throat, or other symptoms of a cold or flu. Do not treat yourself. This drug decreases your body's ability to fight infections. Try to avoid being around people who are sick. This medicine may increase your risk to   bruise or bleed. Call your doctor or health care professional if you notice any unusual bleeding. Be careful brushing and flossing your teeth or using a toothpick because you may get an infection or bleed more easily. If you have any dental work done, tell your dentist you are receiving this medicine. Avoid taking products that contain aspirin, acetaminophen, ibuprofen, naproxen, or ketoprofen unless instructed by your doctor. These medicines may hide a fever. Do not become pregnant while taking this medicine. Women should inform their doctor if they wish to become pregnant or think they might be pregnant. There is a potential for serious side effects to an unborn child.  Talk to your health care professional or pharmacist for more information. Do not breast-feed an infant while taking this medicine. What side effects may I notice from receiving this medicine? Side effects that you should report to your doctor or health care professional as soon as possible:  allergic reactions like skin rash, itching or hives, swelling of the face, lips, or tongue  signs of infection - fever or chills, cough, sore throat, pain or difficulty passing urine  signs of decreased platelets or bleeding - bruising, pinpoint red spots on the skin, black, tarry stools, nosebleeds  signs of decreased red blood cells - unusually weak or tired, fainting spells, lightheadedness  breathing problems  changes in hearing  changes in vision  chest pain  high blood pressure  low blood counts - This drug may decrease the number of white blood cells, red blood cells and platelets. You may be at increased risk for infections and bleeding.  nausea and vomiting  pain, swelling, redness or irritation at the injection site  pain, tingling, numbness in the hands or feet  problems with balance, talking, walking  trouble passing urine or change in the amount of urine Side effects that usually do not require medical attention (report to your doctor or health care professional if they continue or are bothersome):  hair loss  loss of appetite  metallic taste in the mouth or changes in taste This list may not describe all possible side effects. Call your doctor for medical advice about side effects. You may report side effects to FDA at 1-800-FDA-1088. Where should I keep my medicine? This drug is given in a hospital or clinic and will not be stored at home. NOTE: This sheet is a summary. It may not cover all possible information. If you have questions about this medicine, talk to your doctor, pharmacist, or health care provider.  2020 Elsevier/Gold Standard (2007-04-25  14:38:05)  Coronavirus (COVID-19) Are you at risk?  Are you at risk for the Coronavirus (COVID-19)?  To be considered HIGH RISK for Coronavirus (COVID-19), you have to meet the following criteria:  . Traveled to [place], [place], [place], [place] or [place]; or in the [place] to [place], [place], [place], or [place]; and have fever, cough, and shortness of breath within the last 2 weeks of travel OR . Been in close contact with a person diagnosed with COVID-19 within the last 2 weeks and have fever, cough, and shortness of breath . IF YOU DO NOT MEET THESE CRITERIA, YOU ARE CONSIDERED LOW RISK FOR COVID-19.  What to do if you are HIGH RISK for COVID-19?  . If you are having a medical emergency, call 911. . Seek medical care right away. Before you go to a doctor's office, urgent care or emergency department, call ahead and tell them about your recent travel, contact   with someone diagnosed with COVID-19, and your symptoms. You should receive instructions from your physician's office regarding next steps of care.  . When you arrive at healthcare provider, tell the healthcare staff immediately you have returned from visiting [place], [place], [place], [place] or [place]; or traveled in the [place] to [place], [place], [place], or [place]; in the last two weeks or you have been in close contact with a person diagnosed with COVID-19 in the last 2 weeks.   . Tell the health care staff about your symptoms: fever, cough and shortness of breath. . After you have been seen by a medical provider, you will be either: o Tested for (COVID-19) and discharged home on quarantine except to seek medical care if symptoms worsen, and asked to  - Stay home and avoid contact with others until you get your results (4-5 days)  - Avoid travel on public transportation if possible (such as bus, train, or airplane) or o Sent to the Emergency Department by EMS for evaluation, COVID-19 testing, and  possible admission depending on your condition and test results.  What to do if you are LOW RISK for COVID-19?  Reduce your risk of any infection by using the same precautions used for avoiding the common cold or flu:  . Wash your hands often with soap and warm water for at least 20 seconds.  If soap and water are not readily available, use an alcohol-based hand sanitizer with at least 60% alcohol.  . If coughing or sneezing, cover your mouth and nose by coughing or sneezing into the elbow areas of your shirt or coat, into a tissue or into your sleeve (not your hands). . Avoid shaking hands with others and consider head nods or verbal greetings only. . Avoid touching your eyes, nose, or mouth with unwashed hands.  . Avoid close contact with people who are sick. . Avoid places or events with large numbers of people in one location, like concerts or sporting events. . Carefully consider travel plans you have or are making. . If you are planning any travel outside or inside the [place], visit the CDC's Travelers' Health webpage for the latest health notices. . If you have some symptoms but not all symptoms, continue to monitor at home and seek medical attention if your symptoms worsen. . If you are having a medical emergency, call 911.   ADDITIONAL HEALTHCARE OPTIONS FOR PATIENTS  [facility] / e-Visit: [email or web address]         [facility]: [phone]   Urgent Care: [phone]                   [facility]: [phone]   

## 2018-08-23 NOTE — Progress Notes (Signed)
Patient tolerated the rest of her infusion without any issues.

## 2018-08-23 NOTE — Progress Notes (Signed)
    DATE:  08/23/2018                                          X  CHEMO/IMMUNOTHERAPY REACTION            MD:  Dr. Heath Lark   AGENT/BLOOD PRODUCT RECEIVING TODAY:               Carboplatin and paclitaxel   AGENT/BLOOD PRODUCT RECEIVING IMMEDIATELY PRIOR TO REACTION:           Paclitaxel   VS: BP:      156/81   P:        66       SPO2:        92% on room air                  REACTION(S):            Hot flash   PREMEDS:      Aloxi, dexamethasone, Emend, and Pepcid   INTERVENTION: Repeated Pepcid 20 mg IV x1   Review of Systems  Review of Systems  Constitutional: Negative for chills, diaphoresis and fever.       Hot flash  HENT: Negative for trouble swallowing and voice change.   Respiratory: Negative for cough, chest tightness, shortness of breath and wheezing.   Cardiovascular: Negative for chest pain and palpitations.  Gastrointestinal: Negative for abdominal pain, constipation, diarrhea, nausea and vomiting.  Musculoskeletal: Negative for back pain and myalgias.  Neurological: Negative for dizziness, light-headedness and headaches.     Physical Exam  Physical Exam Constitutional:      General: She is not in acute distress.    Appearance: She is not diaphoretic.  HENT:     Head: Normocephalic and atraumatic.  Cardiovascular:     Rate and Rhythm: Normal rate and regular rhythm.     Heart sounds: Normal heart sounds. No murmur. No friction rub. No gallop.   Pulmonary:     Effort: Pulmonary effort is normal. No respiratory distress.     Breath sounds: Normal breath sounds. No wheezing or rales.  Skin:    General: Skin is warm and dry.     Findings: No erythema or rash.  Neurological:     Mental Status: She is alert.  Psychiatric:        Mood and Affect: Mood normal.        Behavior: Behavior normal.        Thought Content: Thought content normal.        Judgment: Judgment normal.     OUTCOME:                 The patient's symptoms abated after paclitaxel was  paused and she was given an extra dose of Pepcid 20 mg IV x1.  She was able to restart and complete her treatment today.   Sandi Mealy, MHS, PA-C

## 2018-08-23 NOTE — Progress Notes (Signed)
First time Taxol start rate of 24 ml/hr x 6 ml for 48mins.

## 2018-08-23 NOTE — Progress Notes (Signed)
1236: patient starts to complain of "feeling like she is having hot flashes", she denies this as being normal for her now. Denies pain, SOB or any other symptoms. Taxol infusion stopped immediately. Lucianne Lei, PA [facility] at chair side.  1239: Pepcid 20 MG IVPB administered. 1310: Taxol infusion restarted. Patient verbalizes no more "hotflashes" and denies any further complains or concerns at this time. Will continue to monitor for the rest of the infusion.

## 2018-08-24 ENCOUNTER — Telehealth: Payer: Self-pay

## 2018-08-24 ENCOUNTER — Encounter: Payer: Self-pay | Admitting: Hematology and Oncology

## 2018-08-24 NOTE — Progress Notes (Signed)
[facility] OFFICE PROGRESS NOTE  Patient Care Team: [name], DO as PCP - General (Family Medicine)  ASSESSMENT & PLAN:  Uterine cancer Christina Hickman) She has minor skin bruises at the port incision site We will proceed with treatment as scheduled I will let radiation oncologist to schedule radiation therapy after cycle 2 of therapy   No orders of the defined types were placed in this encounter.   INTERVAL HISTORY: Please see below for problem oriented charting. She is seen prior to the start of treatment She is doing well after port placement Her appetite is stable Her incision wound is all well-healed She noted some mild bruises over the port area She has no further questions and concerns about side effects of therapy  SUMMARY OF ONCOLOGIC HISTORY: Oncology History Overview Note  Her2 positive   Uterine cancer ([place])  06/06/2018 Initial Diagnosis   She presented with postmenopausal bleeding. Initial biopsy was negative for malignancy   06/06/2018 Pathology Results   She saw Dr Adah Perl in [place] on 06/06/18 and a pap was performed which showed ASCUS.   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 The left tube and ovary was adherent to the sigmoid colon and was not removed.    08/11/2018 Initial Diagnosis   Uterine cancer ([place])   08/15/2018 Imaging   CT scan of chest, abdomen and pelvis 1. Single indeterminate 8 mm aorto-caval retroperitoneal lymph node. No other  signs metastatic disease within the chest, abdomen, or pelvis. 2. Moderate to large hiatal hernia. 3. Colonic diverticulosis. No radiographic evidence of diverticulitis.   08/15/2018 Cancer Staging   Staging form: Corpus Uteri - Carcinoma and Carcinosarcoma, AJCC 8th Edition - Pathologic: Stage Unknown (pT1b, pNX, cM0) - Signed by Heath Lark, MD on 08/15/2018   08/22/2018 Procedure   Status post right IJ port catheter placement     REVIEW OF SYSTEMS:   Constitutional: Denies fevers, chills or abnormal weight loss Eyes: Denies blurriness of vision Ears, nose, mouth, throat, and face: Denies mucositis or sore throat Respiratory: Denies cough, dyspnea or wheezes Cardiovascular: Denies palpitation, chest discomfort or lower extremity swelling Gastrointestinal:  Denies nausea, heartburn or change in bowel habits Skin: Denies abnormal skin rashes Lymphatics: Denies new lymphadenopathy or easy bruising Neurological:Denies numbness, tingling or new weaknesses Behavioral/Psych: Mood is stable, no new changes  All other systems were reviewed with the patient and are negative.  I have reviewed the past medical history, past surgical history, social history and family history with the patient and they are unchanged from previous note.  ALLERGIES:  is allergic to cephalexin; losartan; shellfish-derived products; sulfa antibiotics; serotonin; alendronate; beef-derived products; galactose; and lisinopril.  MEDICATIONS:  Current Outpatient Medications  Medication Sig Dispense Refill  . acetaminophen (TYLENOL) 500 MG tablet Take 500 mg by mouth every 6 (six) hours as needed.    . clonazePAM (KLONOPIN) 1 MG tablet 1 mg 2 (two) times daily.     Marland Kitchen dexamethasone (DECADRON) 4 MG tablet Take 2  tabs at the night before and 2 tab the morning  of chemotherapy, every 3 weeks, by mouth (Patient not taking: Reported on 08/16/2018) 24 tablet 0  . EPINEPHrine 0.3 mg/0.3 mL IJ SOAJ injection epinephrine 0.3 mg/0.3  mL injection, auto-injector  USE AS DIRECTED    . lidocaine-prilocaine (EMLA) cream Apply to affected area once (Patient not taking: Reported on 08/16/2018) 30 g 3  . naproxen sodium (ALEVE) 220 MG tablet Take 220 mg by mouth.    . Nebivolol HCl (BYSTOLIC) 20 MG TABS Bystolic 20 mg tablet  TAKE ONE TABLET BY MOUTH DAILY    . nitroGLYCERIN (NITROSTAT) 0.4 MG SL tablet nitroglycerin 0.4 mg sublingual tablet    . omeprazole (PRILOSEC) 40 MG capsule Take 40 mg by mouth daily.     . ondansetron (ZOFRAN) 8 MG tablet Take 1 tablet (8 mg total) by mouth every 8 (eight) hours as needed for refractory nausea / vomiting. (Patient not taking: Reported on 08/16/2018) 30 tablet 1  . prochlorperazine (COMPAZINE) 10 MG tablet Take 1 tablet (10 mg total) by mouth every 6 (six) hours as needed (Nausea or vomiting). (Patient not taking: Reported on 08/16/2018) 30 tablet 1   No current facility-administered medications for this visit.     PHYSICAL EXAMINATION: ECOG PERFORMANCE STATUS: 1 - Symptomatic but completely ambulatory  Vitals:   08/23/18 0810  BP: (!) 161/88  Pulse: 72  Resp: 17  Temp: 98.9 F (37.2 C)  SpO2: 100%   Filed Weights   08/23/18 0810  Weight: 156 lb 6.4 oz (70.9 kg)    GENERAL:alert, no distress and comfortable SKIN: Noted skin bruises at the port incision site EYES: normal, Conjunctiva are pink and non-injected, sclera clear OROPHARYNX:no exudate, no erythema and lips, buccal mucosa, and tongue normal  NECK: supple, thyroid normal size, non-tender, without nodularity LYMPH:  no palpable lymphadenopathy in the cervical, axillary or inguinal LUNGS: clear to auscultation and percussion with normal breathing effort HEART: regular rate & rhythm and no murmurs and no lower extremity edema ABDOMEN:abdomen soft, non-tender and normal bowel sounds Musculoskeletal:no cyanosis of digits and no clubbing  NEURO: alert & oriented x 3 with fluent speech, no focal motor/sensory  deficits  LABORATORY DATA:  I have reviewed the data as listed    Component Value Date/Time   NA 142 08/22/2018 0809   K 3.7 08/22/2018 0809   CL 106 08/22/2018 0809   CO2 25 08/22/2018 0809   GLUCOSE 88 08/22/2018 0809   BUN 12 08/22/2018 0809   CREATININE 0.55 08/22/2018 0809   CALCIUM 8.9 08/22/2018 0809   PROT 7.2 08/22/2018 0809   ALBUMIN 3.8 08/22/2018 0809   AST 43 (H) 08/22/2018 0809   ALT 41 08/22/2018 0809   ALKPHOS 78 08/22/2018 0809   BILITOT 0.3 08/22/2018 0809   GFRNONAA >60 08/22/2018 0809   GFRAA >60 08/22/2018 0809    No results found for: SPEP, UPEP  Lab Results  Component Value Date   WBC 5.7 08/22/2018   NEUTROABS 3.5 08/22/2018   HGB 11.5 (L) 08/22/2018   HCT 38.9 08/22/2018   MCV 92.8 08/22/2018   PLT 210 08/22/2018      Chemistry      Component Value Date/Time   NA 142 08/22/2018 0809   K 3.7 08/22/2018 0809   CL 106 08/22/2018 0809   CO2 25 08/22/2018 0809   BUN 12 08/22/2018 0809   CREATININE 0.55 08/22/2018 0809      Component Value Date/Time   CALCIUM 8.9 08/22/2018 0809   ALKPHOS 78 08/22/2018 0809   AST  43 (H) 08/22/2018 0809   ALT 41 08/22/2018 0809   BILITOT 0.3 08/22/2018 0809       RADIOGRAPHIC STUDIES: I have personally reviewed the radiological images as listed and agreed with the findings in the report. Ct Chest W Contrast  Result Date: 08/15/2018 CLINICAL DATA:  Newly diagnosed endometrial carcinoma. Status hysterectomy 3 weeks ago. Staging. EXAM: CT CHEST, ABDOMEN, AND PELVIS WITH CONTRAST TECHNIQUE: Multidetector CT imaging of the chest, abdomen and pelvis was performed following the standard protocol during bolus administration of intravenous contrast. CONTRAST:  124m OMNIPAQUE IOHEXOL 300 MG/ML  SOLN COMPARISON:  None. FINDINGS: CT CHEST FINDINGS Cardiovascular: No acute findings. Mediastinum/Lymph Nodes: No masses or pathologically enlarged lymph nodes identified. Lungs/Pleura: No pulmonary infiltrate or mass  identified. No effusion present. Musculoskeletal:  No suspicious bone lesions identified. CT ABDOMEN AND PELVIS FINDINGS Hepatobiliary: No masses identified. Gallbladder is unremarkable. Pancreas:  No mass or inflammatory changes. Spleen:  Within normal limits in size and appearance. Adrenals/Urinary tract:  No masses or hydronephrosis. Stomach/Bowel: Moderate to large hiatal hernia. No evidence of obstruction, inflammatory process, or abnormal fluid collections. Diverticulosis is seen mainly involving the sigmoid colon, however there is no evidence of diverticulitis. Vascular/Lymphatic: No pathologically enlarged lymph nodes identified. An aortocaval lymph node measuring 8 mm is noted image 69/2. No abdominal aortic aneurysm. Reproductive: Expected postop changes from hysterectomy. No pelvic mass or abnormal fluid collections identified. Other:  None. Musculoskeletal: No suspicious bone lesions identified. Old benign appearing fracture deformities seen involving the right superior and inferior pubic rami. IMPRESSION: 1. Single indeterminate 8 mm aorto-caval retroperitoneal lymph node. No other signs metastatic disease within the chest, abdomen, or pelvis. 2. Moderate to large hiatal hernia. 3. Colonic diverticulosis. No radiographic evidence of diverticulitis. Electronically Signed   By: JMarlaine HindM.D.   On: 08/15/2018 08:39   Ct Abdomen Pelvis W Contrast  Result Date: 08/15/2018 CLINICAL DATA:  Newly diagnosed endometrial carcinoma. Status hysterectomy 3 weeks ago. Staging. EXAM: CT CHEST, ABDOMEN, AND PELVIS WITH CONTRAST TECHNIQUE: Multidetector CT imaging of the chest, abdomen and pelvis was performed following the standard protocol during bolus administration of intravenous contrast. CONTRAST:  1030mOMNIPAQUE IOHEXOL 300 MG/ML  SOLN COMPARISON:  None. FINDINGS: CT CHEST FINDINGS Cardiovascular: No acute findings. Mediastinum/Lymph Nodes: No masses or pathologically enlarged lymph nodes identified.  Lungs/Pleura: No pulmonary infiltrate or mass identified. No effusion present. Musculoskeletal:  No suspicious bone lesions identified. CT ABDOMEN AND PELVIS FINDINGS Hepatobiliary: No masses identified. Gallbladder is unremarkable. Pancreas:  No mass or inflammatory changes. Spleen:  Within normal limits in size and appearance. Adrenals/Urinary tract:  No masses or hydronephrosis. Stomach/Bowel: Moderate to large hiatal hernia. No evidence of obstruction, inflammatory process, or abnormal fluid collections. Diverticulosis is seen mainly involving the sigmoid colon, however there is no evidence of diverticulitis. Vascular/Lymphatic: No pathologically enlarged lymph nodes identified. An aortocaval lymph node measuring 8 mm is noted image 69/2. No abdominal aortic aneurysm. Reproductive: Expected postop changes from hysterectomy. No pelvic mass or abnormal fluid collections identified. Other:  None. Musculoskeletal: No suspicious bone lesions identified. Old benign appearing fracture deformities seen involving the right superior and inferior pubic rami. IMPRESSION: 1. Single indeterminate 8 mm aorto-caval retroperitoneal lymph node. No other signs metastatic disease within the chest, abdomen, or pelvis. 2. Moderate to large hiatal hernia. 3. Colonic diverticulosis. No radiographic evidence of diverticulitis. Electronically Signed   By: JoMarlaine Hind.D.   On: 08/15/2018 08:39   Ir Imaging Guided Port Insertion  Result Date:  08/22/2018 INDICATION: 68 year old female with a history of gynecologic malignancy, referred for port catheter EXAM: IMPLANTED PORT A CATH PLACEMENT WITH ULTRASOUND AND FLUOROSCOPIC GUIDANCE MEDICATIONS: 900 mg clindamycin; The antibiotic was administered within an appropriate time interval prior to skin puncture. ANESTHESIA/SEDATION: Moderate (conscious) sedation was employed during this procedure. A total of Versed 2.0 mg and Fentanyl 50 mcg was administered intravenously. Moderate Sedation  Time: 18 minutes. The patient's level of consciousness and vital signs were monitored continuously by radiology nursing throughout the procedure under my direct supervision. FLUOROSCOPY TIME:  0 minutes, 6 seconds (1 mGy) COMPLICATIONS: None PROCEDURE: The procedure, risks, benefits, and alternatives were explained to the patient. Questions regarding the procedure were encouraged and answered. The patient understands and consents to the procedure. Ultrasound survey was performed with images stored and sent to PACs. The right neck and chest was prepped with chlorhexidine, and draped in the usual sterile fashion using maximum barrier technique (cap and mask, sterile gown, sterile gloves, large sterile sheet, hand hygiene and cutaneous antiseptic). Antibiotic prophylaxis was provided with 2.0g Ancef administered IV one hour prior to skin incision. Local anesthesia was attained by infiltration with 1% lidocaine without epinephrine. Ultrasound demonstrated patency of the right internal jugular vein, and this was documented with an image. Under real-time ultrasound guidance, this vein was accessed with a 21 gauge micropuncture needle and image documentation was performed. A small dermatotomy was made at the access site with an 11 scalpel. A 0.018" wire was advanced into the SVC and used to estimate the length of the internal catheter. The access needle exchanged for a 32F micropuncture vascular sheath. The 0.018" wire was then removed and a 0.035" wire advanced into the IVC. An appropriate location for the subcutaneous reservoir was selected below the clavicle and an incision was made through the skin and underlying soft tissues. The subcutaneous tissues were then dissected using a combination of blunt and sharp surgical technique and a pocket was formed. A single lumen power injectable portacatheter was then tunneled through the subcutaneous tissues from the pocket to the dermatotomy and the port reservoir placed within  the subcutaneous pocket. The venous access site was then serially dilated and a peel away vascular sheath placed over the wire. The wire was removed and the port catheter advanced into position under fluoroscopic guidance. The catheter tip is positioned in the cavoatrial junction. This was documented with a spot image. The portacatheter was then tested and found to flush and aspirate well. The port was flushed with saline followed by 100 units/mL heparinized saline. The pocket was then closed in two layers using first subdermal inverted interrupted absorbable sutures followed by a running subcuticular suture. The epidermis was then sealed with Dermabond. The dermatotomy at the venous access site was also seal with Dermabond. Patient tolerated the procedure well and remained hemodynamically stable throughout. No complications encountered and no significant blood loss encountered IMPRESSION: Status post right IJ port catheter placement. Signed, Dulcy Fanny. Dellia Nims, RPVI [facility] Electronically Signed   By: Corrie Mckusick D.O.   On: 08/22/2018 10:20    All questions were answered. The patient knows to call the clinic with any problems, questions or concerns. No barriers to learning was detected.  I spent 15 minutes counseling the patient face to face. The total time spent in the appointment was 20 minutes and more than 50% was on counseling and review of test results  Heath Lark, MD 08/24/2018 1:04 PM

## 2018-08-24 NOTE — Telephone Encounter (Signed)
Attempt to call pt for chemo f/u call.   No answer, no vm.

## 2018-08-24 NOTE — Assessment & Plan Note (Signed)
She has minor skin bruises at the port incision site We will proceed with treatment as scheduled I will let radiation oncologist to schedule radiation therapy after cycle 2 of therapy

## 2018-08-25 ENCOUNTER — Telehealth: Payer: Self-pay | Admitting: Oncology

## 2018-08-25 NOTE — Telephone Encounter (Signed)
Called Christina Hickman to see how she is feeling after chemotherapy on Wednesday.  She said she is having a little nausea, but not enough where she needs to take an antiemetic.  She also had some pain/aching in her vaginal area which is better.  Advised her that she can take Tylenol as needed.

## 2018-08-28 ENCOUNTER — Telehealth: Payer: Self-pay | Admitting: *Deleted

## 2018-08-28 MED ORDER — TRAMADOL HCL 50 MG PO TABS: 50.0000 mg | ORAL_TABLET | Freq: Four times a day (QID) | ORAL | 0 refills | Status: DC | PRN

## 2018-08-28 NOTE — Telephone Encounter (Signed)
Returned telephone call to patient in regards to joint pain. Patient reports pain is mostly in the evenings and at night. The pain is intense enough to prevent her from sleeping. She will get up to pace to try to alleviate the pain but this does not help. She is using ice/heat and tylenol 1000mg  before bed. She continues to wake up with pain. Patient does not want to start narcotic pain medication yet. She is willing to try Tramadol. This prescription called into her [facility] per Dr. Alvy Bimler. Patient cautioned against taking this medication and driving or using machinery. Patient verbalized an understanding. She will call this office with any further concerns. Advised patient we would call to follow up on her pain control.

## 2018-08-28 NOTE — Telephone Encounter (Signed)
Called pt to see if she had any educational needs since I was unable to see pt when she was here for treatment.  She reports pain in thighs, knees, legs, feet, & R hand cramping at night.  She reports the majority of her pain is in her knees & rates # 10.  She is taking tylenol XS every 6 hours with not much relief.  She states pain is worse at night.  She has tried ice & heat but doesn't help.  She gets up & walks which she states helps some. Pain is less during day.  She states that she has always had some joint pains particularly R knee pain & had knee surgery years ago.  This is bilateral pain.  She also mentioned some tingling in bottom of feet/toes & staggering some but attributes to stiffness.  She states she doesn't do well with pain meds.  She denies any other problems or educational needs.  Message routed to Dr Robyne Askew

## 2018-08-28 NOTE — Telephone Encounter (Signed)
Hi Sara,  Please call her back. If she does not want narcotics, we can call in tramadol 50 mg 1-2 tabs q 6 hours prn for pain. She can still take tylenol every 6 hours but make sure she is taking 1000 mg each time

## 2018-08-29 ENCOUNTER — Telehealth: Payer: Self-pay | Admitting: *Deleted

## 2018-08-29 NOTE — Telephone Encounter (Signed)
CALLED PATIENT TO INFORM OF NEW HDR [place], SPOKE WITH PATIENT AND SHE IS AWARE OF ALL APPTS. TIME AND DATE, AND SHE IS GOOD WITH ALL APPT. TIMES AND DATES

## 2018-08-29 NOTE — Telephone Encounter (Signed)
XXXX 

## 2018-09-01 ENCOUNTER — Telehealth: Payer: Self-pay | Admitting: *Deleted

## 2018-09-01 NOTE — Telephone Encounter (Signed)
Called patient to remind of New HDR [place] for 09-04-18, spoke with patient and she is aware of these appts.

## 2018-09-04 ENCOUNTER — Ambulatory Visit
Admission: RE | Admit: 2018-09-04 | Discharge: 2018-09-04 | Disposition: A | Discharge status: Home / Self Care | Payer: Medicare Other | Source: Ambulatory Visit | Attending: Radiation Oncology | Admitting: Radiation Oncology

## 2018-09-04 ENCOUNTER — Other Ambulatory Visit: Payer: Self-pay

## 2018-09-04 ENCOUNTER — Encounter: Payer: Self-pay | Admitting: Radiation Oncology

## 2018-09-04 VITALS — BP 139/84 | HR 79 | Temp 98.3°F | Resp 18 | Ht 59.0 in | Wt 150.2 lb

## 2018-09-04 DIAGNOSIS — C541 Malignant neoplasm of endometrium: Secondary | ICD-10-CM

## 2018-09-04 DIAGNOSIS — Z79899 Other long term (current) drug therapy: Secondary | ICD-10-CM | POA: Insufficient documentation

## 2018-09-04 DIAGNOSIS — Z90710 Acquired absence of both cervix and uterus: Secondary | ICD-10-CM | POA: Diagnosis not present

## 2018-09-04 DIAGNOSIS — K449 Diaphragmatic hernia without obstruction or gangrene: Secondary | ICD-10-CM | POA: Insufficient documentation

## 2018-09-04 DIAGNOSIS — K5730 Diverticulosis of large intestine without perforation or abscess without bleeding: Secondary | ICD-10-CM | POA: Diagnosis not present

## 2018-09-04 DIAGNOSIS — C549 Malignant neoplasm of corpus uteri, unspecified: Secondary | ICD-10-CM

## 2018-09-04 DIAGNOSIS — Z510 Encounter for antineoplastic radiation therapy: Secondary | ICD-10-CM | POA: Insufficient documentation

## 2018-09-04 NOTE — Progress Notes (Signed)
Patient in for follow up to be fitted for HDR. Denies any issues or complaints of at this time.

## 2018-09-04 NOTE — Progress Notes (Signed)
  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  Identity was confirmed.  All relevant records and images related to the planned course of therapy were reviewed.  The patient freely provided informed written consent to proceed with treatment after reviewing the details related to the planned course of therapy. The consent form was witnessed and verified by the simulation staff.  Then, the patient was set-up in a stable reproducible  supine position for radiation therapy.  CT images were obtained.  Surface markings were placed.  The CT images were loaded into the planning software.  Then the target and avoidance structures were contoured.  Treatment planning then occurred.  The radiation prescription was entered and confirmed.   I have requested : Brachytherapy Isodose Plan and Dosimetry Calculations to plan the radiation distribution.    PLAN:  The patient will receive 30 Gy in 5 fractions.  The patient will be treated with a 3 cm diameter cylinder with a treatment length of 3 cm.  Iridium 192 will be the high-dose-rate source.    ________________________________  Blair Promise, PhD, MD   This document serves as a record of services personally performed by Gery Pray, MD. It was created on his behalf by Wilburn Mylar, a trained medical scribe. The creation of this record is based on the scribe's personal observations and the provider's statements to them. This document has been checked and approved by the attending provider.

## 2018-09-04 NOTE — Progress Notes (Signed)
  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 She will be treated with a 3.0 cm diameter segmented cylinder. This conforms to her anatomy without undue discomfort.  Vaginal brachytherapy procedure node: The patient was brought to the [place] suite. Identity was confirmed. All relevant records and images related to the planned course of therapy were reviewed. The patient freely provided informed written consent to proceed with treatment after reviewing the details related to the planned course of therapy. The consent form was witnessed and verified by the simulation staff. Then, the patient was set-up in a stable reproducible supine position for radiation therapy. Pelvic exam revealed the vaginal cuff to be intact. The patient's custom vaginal cylinder was placed in the proximal vagina. This was affixed to the CT/MR stabilization plate to prevent slippage. Patient tolerated the placement well.  Verification simulation note:  A fiducial marker was placed within the vaginal cylinder. An AP and lateral film was then obtained through the pelvis area. This documented accurate position of the vaginal cylinder for treatment.  HDR BRACHYTHERAPY TREATMENT  The remote afterloading device was affixed to the vaginal cylinder by catheter. Patient then proceeded to undergo her first high-dose-rate treatment directed at the proximal vagina. The patient was prescribed a dose of 6 gray to be delivered to the mucosal surface. Treatment length was 3.0 cm. Patient was treated with 1 channel using 8 dwell positions. Treatment time was 232.9 seconds. Iridium 192 was  the high-dose-rate source for treatment. The patient tolerated the treatment well. After completion of her therapy, a radiation survey was performed documenting return of the iridium source into the [employer] safe.   PLAN: Patient will return next week on 09/11/2018 for her second high-dose-rate treatment. ________________________________  Blair Promise, PhD, MD   This document serves as a record of services personally performed by Gery Pray, MD. It was created on his behalf by Wilburn Mylar, a trained medical scribe. The creation of this record is based on the scribe's personal observations and the provider's statements to them. This document has been checked and approved by the attending provider.

## 2018-09-04 NOTE — Addendum Note (Signed)
Encounter addended by: Gery Pray, MD on: 09/04/2018 6:56 PM  Actions taken: Allergies reviewed, Medication List reviewed, Visit diagnoses modified, Problem List modified, Clinical Note Signed, Level of Service modified, Follow-up modified

## 2018-09-04 NOTE — Progress Notes (Signed)
Radiation Oncology         [phone] ________________________________  Name: Christina Hickman MRN: [id number]  Date: 09/04/2018  DOB: 1950/04/22  Vaginal Brachytherapy Procedure Note  CC: [name], DO Elvina Mattes Claudette Laws, DO    ICD-10-CM   1. Malignant neoplasm of body of uterus, unspecified site [facility])  C54.9   2. 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  She has received 1 cycle of adjuvant chemotherapy.  She did develop some pain in her legs after this cycle pain medication this issue much improved.  She denies any vaginal bleeding or discharge.  She denies any pain in the pelvis area.  She denies any flank pain.  ALLERGIES: is allergic to cephalexin; losartan; shellfish-derived products; sulfa antibiotics; serotonin; alendronate; beef-derived products; galactose; and lisinopril.  Meds: Current Outpatient Medications  Medication Sig Dispense Refill   acetaminophen (TYLENOL) 500 MG tablet Take 500 mg by mouth every 6 (six) hours as needed.     clonazePAM (KLONOPIN) 1 MG tablet 1 mg 2 (two) times daily.      EPINEPHrine 0.3 mg/0.3 mL IJ SOAJ injection epinephrine 0.3 mg/0.3 mL injection, auto-injector  USE AS DIRECTED     naproxen sodium (ALEVE) 220 MG tablet Take 220 mg by mouth.     Nebivolol HCl (BYSTOLIC) 20 MG TABS Bystolic 20 mg tablet  TAKE ONE TABLET BY MOUTH DAILY     omeprazole (PRILOSEC) 40 MG capsule Take 40 mg by mouth daily.      traMADol (ULTRAM) 50 MG tablet Take 1 tablet (50 mg total) by mouth every 6 (six) hours as needed. 30 tablet 0   dexamethasone (DECADRON) 4 MG tablet Take 2  tabs at the night before and 2 tab the morning of chemotherapy, every 3 weeks, by mouth (Patient not taking: Reported on 08/16/2018) 24 tablet 0   lidocaine-prilocaine (EMLA) cream Apply to affected area once (Patient  not taking: Reported on 08/16/2018) 30 g 3   nitroGLYCERIN (NITROSTAT) 0.4 MG SL tablet nitroglycerin 0.4 mg sublingual tablet     ondansetron (ZOFRAN) 8 MG tablet Take 1 tablet (8 mg total) by mouth every 8 (eight) hours as needed for refractory nausea / vomiting. (Patient not taking: Reported on 08/16/2018) 30 tablet 1   prochlorperazine (COMPAZINE) 10 MG tablet Take 1 tablet (10 mg total) by mouth every 6 (six) hours as needed (Nausea or vomiting). (Patient not taking: Reported on 08/16/2018) 30 tablet 1   No current facility-administered medications for this encounter.     Physical Findings: The patient is in no acute distress. Patient is alert and oriented.  height is 4\' 11"  (1.499 m) and weight is 150 lb 4 oz (68.2 kg). Her temporal temperature is 98.3 F (36.8 C). Her blood pressure is 139/84 and her pulse is 79. Her respiration is 18 and oxygen saturation is 99%.   No palpable cervical, supraclavicular or axillary lymphoadenopathy. The heart has a regular rate and rhythm. The lungs are clear to auscultation. Abdomen soft and non-tender.  On pelvic examination the external genitalia were unremarkable. A speculum exam was performed. Vaginal cuff intact, no mucosal lesions. On bimanual exam there were no pelvic masses appreciated.  Lab Findings: Lab Results  Component Value Date   WBC 5.7 08/22/2018   HGB 11.5 (L) 08/22/2018   HCT 38.9 08/22/2018   MCV 92.8 08/22/2018  PLT 210 08/22/2018    Radiographic Findings: Ct Chest W Contrast  Result Date: 08/15/2018 CLINICAL DATA:  Newly diagnosed endometrial carcinoma. Status hysterectomy 3 weeks ago. Staging. EXAM: CT CHEST, ABDOMEN, AND PELVIS WITH CONTRAST TECHNIQUE: Multidetector CT imaging of the chest, abdomen and pelvis was performed following the standard protocol during bolus administration of intravenous contrast. CONTRAST:  128mL OMNIPAQUE IOHEXOL 300 MG/ML  SOLN COMPARISON:  None. FINDINGS: CT CHEST FINDINGS Cardiovascular: No  acute findings. Mediastinum/Lymph Nodes: No masses or pathologically enlarged lymph nodes identified. Lungs/Pleura: No pulmonary infiltrate or mass identified. No effusion present. Musculoskeletal:  No suspicious bone lesions identified. CT ABDOMEN AND PELVIS FINDINGS Hepatobiliary: No masses identified. Gallbladder is unremarkable. Pancreas:  No mass or inflammatory changes. Spleen:  Within normal limits in size and appearance. Adrenals/Urinary tract:  No masses or hydronephrosis. Stomach/Bowel: Moderate to large hiatal hernia. No evidence of obstruction, inflammatory process, or abnormal fluid collections. Diverticulosis is seen mainly involving the sigmoid colon, however there is no evidence of diverticulitis. Vascular/Lymphatic: No pathologically enlarged lymph nodes identified. An aortocaval lymph node measuring 8 mm is noted image 69/2. No abdominal aortic aneurysm. Reproductive: Expected postop changes from hysterectomy. No pelvic mass or abnormal fluid collections identified. Other:  None. Musculoskeletal: No suspicious bone lesions identified. Old benign appearing fracture deformities seen involving the right superior and inferior pubic rami. IMPRESSION: 1. Single indeterminate 8 mm aorto-caval retroperitoneal lymph node. No other signs metastatic disease within the chest, abdomen, or pelvis. 2. Moderate to large hiatal hernia. 3. Colonic diverticulosis. No radiographic evidence of diverticulitis. Electronically Signed   By: Marlaine Hind M.D.   On: 08/15/2018 08:39   Ct Abdomen Pelvis W Contrast  Result Date: 08/15/2018 CLINICAL DATA:  Newly diagnosed endometrial carcinoma. Status hysterectomy 3 weeks ago. Staging. EXAM: CT CHEST, ABDOMEN, AND PELVIS WITH CONTRAST TECHNIQUE: Multidetector CT imaging of the chest, abdomen and pelvis was performed following the standard protocol during bolus administration of intravenous contrast. CONTRAST:  180mL OMNIPAQUE IOHEXOL 300 MG/ML  SOLN COMPARISON:  None.  FINDINGS: CT CHEST FINDINGS Cardiovascular: No acute findings. Mediastinum/Lymph Nodes: No masses or pathologically enlarged lymph nodes identified. Lungs/Pleura: No pulmonary infiltrate or mass identified. No effusion present. Musculoskeletal:  No suspicious bone lesions identified. CT ABDOMEN AND PELVIS FINDINGS Hepatobiliary: No masses identified. Gallbladder is unremarkable. Pancreas:  No mass or inflammatory changes. Spleen:  Within normal limits in size and appearance. Adrenals/Urinary tract:  No masses or hydronephrosis. Stomach/Bowel: Moderate to large hiatal hernia. No evidence of obstruction, inflammatory process, or abnormal fluid collections. Diverticulosis is seen mainly involving the sigmoid colon, however there is no evidence of diverticulitis. Vascular/Lymphatic: No pathologically enlarged lymph nodes identified. An aortocaval lymph node measuring 8 mm is noted image 69/2. No abdominal aortic aneurysm. Reproductive: Expected postop changes from hysterectomy. No pelvic mass or abnormal fluid collections identified. Other:  None. Musculoskeletal: No suspicious bone lesions identified. Old benign appearing fracture deformities seen involving the right superior and inferior pubic rami. IMPRESSION: 1. Single indeterminate 8 mm aorto-caval retroperitoneal lymph node. No other signs metastatic disease within the chest, abdomen, or pelvis. 2. Moderate to large hiatal hernia. 3. Colonic diverticulosis. No radiographic evidence of diverticulitis. Electronically Signed   By: Marlaine Hind M.D.   On: 08/15/2018 08:39   Ir Imaging Guided Port Insertion  Result Date: 08/22/2018 INDICATION: 68 year old female with a history of gynecologic malignancy, referred for port catheter EXAM: IMPLANTED PORT A CATH PLACEMENT WITH ULTRASOUND AND FLUOROSCOPIC GUIDANCE MEDICATIONS: 900 mg clindamycin; The antibiotic was administered  within an appropriate time interval prior to skin puncture. ANESTHESIA/SEDATION: Moderate  (conscious) sedation was employed during this procedure. A total of Versed 2.0 mg and Fentanyl 50 mcg was administered intravenously. Moderate Sedation Time: 18 minutes. The patient's level of consciousness and vital signs were monitored continuously by radiology nursing throughout the procedure under my direct supervision. FLUOROSCOPY TIME:  0 minutes, 6 seconds (1 mGy) COMPLICATIONS: None PROCEDURE: The procedure, risks, benefits, and alternatives were explained to the patient. Questions regarding the procedure were encouraged and answered. The patient understands and consents to the procedure. Ultrasound survey was performed with images stored and sent to PACs. The right neck and chest was prepped with chlorhexidine, and draped in the usual sterile fashion using maximum barrier technique (cap and mask, sterile gown, sterile gloves, large sterile sheet, hand hygiene and cutaneous antiseptic). Antibiotic prophylaxis was provided with 2.0g Ancef administered IV one hour prior to skin incision. Local anesthesia was attained by infiltration with 1% lidocaine without epinephrine. Ultrasound demonstrated patency of the right internal jugular vein, and this was documented with an image. Under real-time ultrasound guidance, this vein was accessed with a 21 gauge micropuncture needle and image documentation was performed. A small dermatotomy was made at the access site with an 11 scalpel. A 0.018" wire was advanced into the SVC and used to estimate the length of the internal catheter. The access needle exchanged for a 27F micropuncture vascular sheath. The 0.018" wire was then removed and a 0.035" wire advanced into the IVC. An appropriate location for the subcutaneous reservoir was selected below the clavicle and an incision was made through the skin and underlying soft tissues. The subcutaneous tissues were then dissected using a combination of blunt and sharp surgical technique and a pocket was formed. A single lumen  power injectable portacatheter was then tunneled through the subcutaneous tissues from the pocket to the dermatotomy and the port reservoir placed within the subcutaneous pocket. The venous access site was then serially dilated and a peel away vascular sheath placed over the wire. The wire was removed and the port catheter advanced into position under fluoroscopic guidance. The catheter tip is positioned in the cavoatrial junction. This was documented with a spot image. The portacatheter was then tested and found to flush and aspirate well. The port was flushed with saline followed by 100 units/mL heparinized saline. The pocket was then closed in two layers using first subdermal inverted interrupted absorbable sutures followed by a running subcuticular suture. The epidermis was then sealed with Dermabond. The dermatotomy at the venous access site was also seal with Dermabond. Patient tolerated the procedure well and remained hemodynamically stable throughout. No complications encountered and no significant blood loss encountered IMPRESSION: Status post right IJ port catheter placement. Signed, Dulcy Fanny. Dellia Nims, RPVI [facility] Radiology Electronically Signed   By: Corrie Mckusick D.O.   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  Patient was fitted for a vaginal cylinder. The patient will be treated with a 3.0 cm diameter cylinder with a treatment length of 3.0 cm. This distended the vaginal vault without undue discomfort. The patient tolerated the procedure well.  The patient was successfully fitted for a vaginal cylinder. The patient is appropriate to begin vaginal brachytherapy.   Plan: The patient will proceed with CT simulation and vaginal brachytherapy today.    _______________________________   Blair Promise, PhD, MD

## 2018-09-04 NOTE — Patient Instructions (Signed)
Coronavirus (COVID-19) Are you at risk?  Are you at risk for the Coronavirus (COVID-19)?  To be considered HIGH RISK for Coronavirus (COVID-19), you have to meet the following criteria:  . Traveled to [place], [place], [place], [place] or [place]; or in the [place] to [place], [place], [place], or [place]; and have fever, cough, and shortness of breath within the last 2 weeks of travel OR . Been in close contact with a person diagnosed with COVID-19 within the last 2 weeks and have fever, cough, and shortness of breath . IF YOU DO NOT MEET THESE CRITERIA, YOU ARE CONSIDERED LOW RISK FOR COVID-19.  What to do if you are HIGH RISK for COVID-19?  . If you are having a medical emergency, call 911. . Seek medical care right away. Before you go to a doctor's office, urgent care or emergency department, call ahead and tell them about your recent travel, contact with someone diagnosed with COVID-19, and your symptoms. You should receive instructions from your physician's office regarding next steps of care.  . When you arrive at healthcare provider, tell the healthcare staff immediately you have returned from visiting [place], [place], [place], [place] or [place]; or traveled in the [place] to [place], [place], [place], or [place]; in the last two weeks or you have been in close contact with a person diagnosed with COVID-19 in the last 2 weeks.   . Tell the health care staff about your symptoms: fever, cough and shortness of breath. . After you have been seen by a medical provider, you will be either: o Tested for (COVID-19) and discharged home on quarantine except to seek medical care if symptoms worsen, and asked to  - Stay home and avoid contact with others until you get your results (4-5 days)  - Avoid travel on public transportation if possible (such as bus, train, or airplane) or o Sent to the Emergency Department by EMS for evaluation, COVID-19 testing, and possible  admission depending on your condition and test results.  What to do if you are LOW RISK for COVID-19?  Reduce your risk of any infection by using the same precautions used for avoiding the common cold or flu:  . Wash your hands often with soap and warm water for at least 20 seconds.  If soap and water are not readily available, use an alcohol-based hand sanitizer with at least 60% alcohol.  . If coughing or sneezing, cover your mouth and nose by coughing or sneezing into the elbow areas of your shirt or coat, into a tissue or into your sleeve (not your hands). . Avoid shaking hands with others and consider head nods or verbal greetings only. . Avoid touching your eyes, nose, or mouth with unwashed hands.  . Avoid close contact with people who are sick. . Avoid places or events with large numbers of people in one location, like concerts or sporting events. . Carefully consider travel plans you have or are making. . If you are planning any travel outside or inside the [place], visit the CDC's Travelers' Health webpage for the latest health notices. . If you have some symptoms but not all symptoms, continue to monitor at home and seek medical attention if your symptoms worsen. . If you are having a medical emergency, call 911.   ADDITIONAL HEALTHCARE OPTIONS FOR PATIENTS  [facility] / e-Visit: [email or web address]         [facility]: [phone]  [facility]: [phone]                   [facility]: [phone]   

## 2018-09-07 ENCOUNTER — Telehealth: Payer: Self-pay | Admitting: *Deleted

## 2018-09-07 NOTE — Telephone Encounter (Signed)
CALLED PATIENT TO REMIND OF HDR TX. FOR 09-11-18 @ 9 AM, LVM FOR A RETURN CALL

## 2018-09-11 ENCOUNTER — Other Ambulatory Visit: Payer: Self-pay

## 2018-09-11 ENCOUNTER — Ambulatory Visit
Admission: RE | Admit: 2018-09-11 | Discharge: 2018-09-11 | Disposition: A | Discharge status: Home / Self Care | Payer: Medicare Other | Source: Ambulatory Visit | Attending: Radiation Oncology | Admitting: Radiation Oncology

## 2018-09-11 DIAGNOSIS — C541 Malignant neoplasm of endometrium: Secondary | ICD-10-CM

## 2018-09-11 DIAGNOSIS — Z510 Encounter for antineoplastic radiation therapy: Secondary | ICD-10-CM | POA: Diagnosis not present

## 2018-09-11 NOTE — Addendum Note (Signed)
Encounter addended by: Ernestene Mention, RN on: 09/11/2018 10:40 AM  Actions taken: Charge Capture section accepted

## 2018-09-11 NOTE — Progress Notes (Signed)
  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 She will be treated with a 3.0 cm diameter segmented cylinder. This conforms to her anatomy without undue discomfort.  Vaginal brachytherapy procedure node: The patient was brought to the [facility] suite. Identity was confirmed. All relevant records and images related to the planned course of therapy were reviewed. The patient freely provided informed written consent to proceed with treatment after reviewing the details related to the planned course of therapy. The consent form was witnessed and verified by the simulation staff. Then, the patient was set-up in a stable reproducible supine position for radiation therapy. Pelvic exam revealed the vaginal cuff to be intact. The patient's custom vaginal cylinder was placed in the proximal vagina. This was affixed to the CT/MR stabilization plate to prevent slippage. Patient tolerated the placement well.  Verification simulation note:  A fiducial marker was placed within the vaginal cylinder. An AP and lateral film was then obtained through the pelvis area. This documented accurate position of the vaginal cylinder for treatment.  HDR BRACHYTHERAPY TREATMENT  The remote afterloading device was affixed to the vaginal cylinder by catheter. Patient then proceeded to undergo her second high-dose-rate treatment directed at the proximal vagina. The patient was prescribed a dose of 6 gray to be delivered to the mucosal surface. Treatment length was 3.0 cm. Patient was treated with 1 channel using 8 dwell positions. Treatment time was 248.8 seconds. Iridium 192  was the high-dose-rate source for treatment. The patient tolerated the treatment well. After completion of her therapy, a radiation survey was performed documenting return of the iridium source into the [employer] safe.   PLAN: Patient will return next week for her third HDR treatment.  ________________________________  Blair Promise, PhD, MD

## 2018-09-13 ENCOUNTER — Inpatient Hospital Stay: Payer: Medicare Other

## 2018-09-13 ENCOUNTER — Inpatient Hospital Stay: Payer: Medicare Other | Attending: Gynecologic Oncology

## 2018-09-13 ENCOUNTER — Encounter: Payer: Self-pay | Admitting: Hematology and Oncology

## 2018-09-13 ENCOUNTER — Inpatient Hospital Stay (HOSPITAL_BASED_OUTPATIENT_CLINIC_OR_DEPARTMENT_OTHER): Payer: Medicare Other | Admitting: Hematology and Oncology

## 2018-09-13 ENCOUNTER — Other Ambulatory Visit: Payer: Self-pay

## 2018-09-13 DIAGNOSIS — C549 Malignant neoplasm of corpus uteri, unspecified: Secondary | ICD-10-CM

## 2018-09-13 DIAGNOSIS — D6481 Anemia due to antineoplastic chemotherapy: Secondary | ICD-10-CM | POA: Diagnosis not present

## 2018-09-13 DIAGNOSIS — M25562 Pain in left knee: Secondary | ICD-10-CM

## 2018-09-13 DIAGNOSIS — G620 Drug-induced polyneuropathy: Secondary | ICD-10-CM | POA: Diagnosis not present

## 2018-09-13 DIAGNOSIS — C541 Malignant neoplasm of endometrium: Secondary | ICD-10-CM

## 2018-09-13 DIAGNOSIS — Z5111 Encounter for antineoplastic chemotherapy: Secondary | ICD-10-CM | POA: Insufficient documentation

## 2018-09-13 DIAGNOSIS — M25561 Pain in right knee: Secondary | ICD-10-CM

## 2018-09-13 DIAGNOSIS — T451X5A Adverse effect of antineoplastic and immunosuppressive drugs, initial encounter: Secondary | ICD-10-CM

## 2018-09-13 LAB — CBC WITH DIFFERENTIAL (CANCER CENTER ONLY)
Abs Immature Granulocytes: 0.05 10*3/uL (ref 0.00–0.07)
Basophils Absolute: 0 10*3/uL (ref 0.0–0.1)
Basophils Relative: 0 %
Eosinophils Absolute: 0 10*3/uL (ref 0.0–0.5)
Eosinophils Relative: 0 %
HCT: 34.5 % — ABNORMAL LOW (ref 36.0–46.0)
Hemoglobin: 10.8 g/dL — ABNORMAL LOW (ref 12.0–15.0)
Immature Granulocytes: 1 %
Lymphocytes Relative: 17 %
Lymphs Abs: 0.8 10*3/uL (ref 0.7–4.0)
MCH: 28.5 pg (ref 26.0–34.0)
MCHC: 31.3 g/dL (ref 30.0–36.0)
MCV: 91 fL (ref 80.0–100.0)
Monocytes Absolute: 0 10*3/uL — ABNORMAL LOW (ref 0.1–1.0)
Monocytes Relative: 1 %
Neutro Abs: 3.9 10*3/uL (ref 1.7–7.7)
Neutrophils Relative %: 81 %
Platelet Count: 264 10*3/uL (ref 150–400)
RBC: 3.79 MIL/uL — ABNORMAL LOW (ref 3.87–5.11)
RDW: 14.6 % (ref 11.5–15.5)
WBC Count: 4.8 10*3/uL (ref 4.0–10.5)
nRBC: 0 % (ref 0.0–0.2)

## 2018-09-13 LAB — CMP (CANCER CENTER ONLY)
ALT: 25 U/L (ref 0–44)
AST: 19 U/L (ref 15–41)
Albumin: 3.5 g/dL (ref 3.5–5.0)
Alkaline Phosphatase: 95 U/L (ref 38–126)
Anion gap: 15 (ref 5–15)
BUN: 19 mg/dL (ref 8–23)
CO2: 20 mmol/L — ABNORMAL LOW (ref 22–32)
Calcium: 9.4 mg/dL (ref 8.9–10.3)
Chloride: 107 mmol/L (ref 98–111)
Creatinine: 0.76 mg/dL (ref 0.44–1.00)
GFR, Est AFR Am: >60 mL/min (ref >60)
GFR, Estimated: >60 mL/min (ref >60)
Glucose, Bld: 220 mg/dL — ABNORMAL HIGH (ref 70–99)
Potassium: 3.1 mmol/L — ABNORMAL LOW (ref 3.5–5.1)
Sodium: 142 mmol/L (ref 135–145)
Total Bilirubin: <0.2 mg/dL — ABNORMAL LOW (ref 0.3–1.2)
Total Protein: 7.6 g/dL (ref 6.5–8.1)

## 2018-09-13 MED ORDER — SODIUM CHLORIDE 0.9% FLUSH
10.0000 mL | INTRAVENOUS | Status: DC | PRN
  Administered 2018-09-13: 10 mL
  Filled 2018-09-13: qty 10

## 2018-09-13 MED ORDER — PALONOSETRON HCL INJECTION 0.25 MG/5ML
INTRAVENOUS | Status: AC
  Filled 2018-09-13: qty 5

## 2018-09-13 MED ORDER — DIPHENHYDRAMINE HCL 50 MG/ML IJ SOLN
50.0000 mg | Freq: Once | INTRAMUSCULAR | Status: AC
  Administered 2018-09-13: 50 mg via INTRAVENOUS

## 2018-09-13 MED ORDER — SODIUM CHLORIDE 0.9 % IV SOLN
Freq: Once | INTRAVENOUS | Status: AC
  Administered 2018-09-13: 11:00:00 via INTRAVENOUS
  Filled 2018-09-13: qty 5

## 2018-09-13 MED ORDER — FAMOTIDINE IN NACL 20-0.9 MG/50ML-% IV SOLN
INTRAVENOUS | Status: AC
  Filled 2018-09-13: qty 50

## 2018-09-13 MED ORDER — PALONOSETRON HCL INJECTION 0.25 MG/5ML
0.2500 mg | Freq: Once | INTRAVENOUS | Status: AC
  Administered 2018-09-13: 0.25 mg via INTRAVENOUS

## 2018-09-13 MED ORDER — FAMOTIDINE IN NACL 20-0.9 MG/50ML-% IV SOLN
20.0000 mg | Freq: Once | INTRAVENOUS | Status: AC
  Administered 2018-09-13: 10:00:00 20 mg via INTRAVENOUS

## 2018-09-13 MED ORDER — SODIUM CHLORIDE 0.9 % IV SOLN
Freq: Once | INTRAVENOUS | Status: AC
  Administered 2018-09-13: 10:00:00 via INTRAVENOUS
  Filled 2018-09-13: qty 250

## 2018-09-13 MED ORDER — HEPARIN SOD (PORK) LOCK FLUSH 100 UNIT/ML IV SOLN
500.0000 [IU] | Freq: Once | INTRAVENOUS | Status: AC | PRN
  Administered 2018-09-13: 500 [IU]
  Filled 2018-09-13: qty 5

## 2018-09-13 MED ORDER — SODIUM CHLORIDE 0.9% FLUSH
10.0000 mL | Freq: Once | INTRAVENOUS | Status: AC
  Administered 2018-09-13: 10 mL
  Filled 2018-09-13: qty 10

## 2018-09-13 MED ORDER — SODIUM CHLORIDE 0.9 % IV SOLN
140.0000 mg/m2 | Freq: Once | INTRAVENOUS | Status: AC
  Administered 2018-09-13: 234 mg via INTRAVENOUS
  Filled 2018-09-13: qty 39

## 2018-09-13 MED ORDER — TRAMADOL HCL 50 MG PO TABS: 100.0000 mg | ORAL_TABLET | Freq: Four times a day (QID) | ORAL | 0 refills | Status: DC | PRN

## 2018-09-13 MED ORDER — SODIUM CHLORIDE 0.9 % IV SOLN
494.4000 mg | Freq: Once | INTRAVENOUS | Status: AC
  Administered 2018-09-13: 15:00:00 490 mg via INTRAVENOUS
  Filled 2018-09-13: qty 49

## 2018-09-13 MED ORDER — DIPHENHYDRAMINE HCL 50 MG/ML IJ SOLN
INTRAMUSCULAR | Status: AC
  Filled 2018-09-13: qty 1

## 2018-09-13 MED FILL — traMADol HCL 50 MG TABS: 50 | 8 days supply | Qty: 60 | Fill #0

## 2018-09-13 NOTE — Progress Notes (Signed)
[facility] OFFICE PROGRESS NOTE  Patient Care Team: [name], DO as PCP - General (Family Medicine)  ASSESSMENT & PLAN:  Uterine cancer [facility]) So far, she tolerated treatment well with expected side effects including mild infusion reaction which was subsequently treated successfully, muscle aches and pain for 2 days after chemo, alopecia and very mild changes in bowel habits as well as minimum neuropathy We will proceed with treatment as scheduled She will also continue radiation therapy I will see her in 3 weeks for cycle 3 of therapy  Anemia due to antineoplastic chemotherapy This is likely due to recent treatment. The patient denies recent history of bleeding such as epistaxis, hematuria or hematochezia. She is asymptomatic from the anemia. I will observe for now.  She does not require transfusion now. I will continue the chemotherapy at current dose without dosage adjustment.  If the anemia gets progressive worse in the future, I might have to delay her treatment or adjust the chemotherapy dose.   Peripheral neuropathy due to chemotherapy [facility]) she has mild peripheral neuropathy, likely related to side effects of treatment. It is only mild, not bothering the patient. I will observe for now If it gets worse in the future, I will consider modifying the dose of the treatment   Arthralgia of both lower legs She has significant arthralgias after treatment Recommend conservative approach She felt that tramadol was helpful I refill her prescription   No orders of the defined types were placed in this encounter.   INTERVAL HISTORY: Please see below for problem oriented charting. She returns for cycle 2 of treatment She had total alopecia since last time I saw her She tolerated treatment well without nausea She has mild constipation which resolved with laxative She had minimum neuropathy Her most significant symptoms are bone pain for 2 days after chemo,  alleviated by tramadol  SUMMARY OF ONCOLOGIC HISTORY: Oncology History Overview Note  Her2 positive   Uterine cancer ([place])  06/06/2018 Initial Diagnosis   She presented with postmenopausal bleeding. Initial biopsy was negative for malignancy   06/06/2018 Pathology Results   She saw Dr Adah Perl in [place] on 06/06/18 and a pap was performed which showed ASCUS.   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 The left tube and ovary was adherent to the sigmoid colon and was not removed.    08/11/2018 Initial Diagnosis   Uterine cancer ([place])   08/15/2018 Imaging   CT scan of chest, abdomen and pelvis 1. Single indeterminate 8 mm aorto-caval retroperitoneal lymph node. No other signs metastatic disease within the chest, abdomen, or pelvis. 2. Moderate to large hiatal hernia. 3. Colonic diverticulosis. No radiographic evidence of diverticulitis.   08/15/2018 Cancer Staging   Staging form: Corpus Uteri - Carcinoma and Carcinosarcoma, AJCC 8th Edition - Pathologic: Stage Unknown (pT1b, pNX, cM0) - Signed by Heath Lark, MD on 08/15/2018   08/22/2018 Procedure   Status post right IJ port catheter placement   08/23/2018 -  Chemotherapy   The patient had carboplatin and taxol for chemotherapy treatment.  REVIEW OF SYSTEMS:   Constitutional: Denies fevers, chills or abnormal weight loss Eyes: Denies blurriness of vision Ears, nose, mouth, throat, and face: Denies mucositis or sore  throat Respiratory: Denies cough, dyspnea or wheezes Cardiovascular: Denies palpitation, chest discomfort or lower extremity swelling Skin: Denies abnormal skin rashes Lymphatics: Denies new lymphadenopathy or easy bruising Behavioral/Psych: Mood is stable, no new changes  All other systems were reviewed with the patient and are negative.  I have reviewed the past medical history, past surgical history, social history and family history with the patient and they are unchanged from previous note.  ALLERGIES:  is allergic to cephalexin; losartan; shellfish-derived products; sulfa antibiotics; serotonin; alendronate; beef-derived products; galactose; and lisinopril.  MEDICATIONS:  Current Outpatient Medications  Medication Sig Dispense Refill  . acetaminophen (TYLENOL) 500 MG tablet Take 500 mg by mouth every 6 (six) hours as needed.    . clonazePAM (KLONOPIN) 1 MG tablet 1 mg 2 (two) times daily.     Marland Kitchen dexamethasone (DECADRON) 4 MG tablet Take 2  tabs at the night before and 2 tab the morning of chemotherapy, every 3 weeks, by mouth (Patient not taking: Reported on 08/16/2018) 24 tablet 0  . EPINEPHrine 0.3 mg/0.3 mL IJ SOAJ injection epinephrine 0.3 mg/0.3 mL injection, auto-injector  USE AS DIRECTED    . lidocaine-prilocaine (EMLA) cream Apply to affected area once (Patient not taking: Reported on 08/16/2018) 30 g 3  . naproxen sodium (ALEVE) 220 MG tablet Take 220 mg by mouth.    . Nebivolol HCl (BYSTOLIC) 20 MG TABS Bystolic 20 mg tablet  TAKE ONE TABLET BY MOUTH DAILY    . nitroGLYCERIN (NITROSTAT) 0.4 MG SL tablet nitroglycerin 0.4 mg sublingual tablet    . omeprazole (PRILOSEC) 40 MG capsule Take 40 mg by mouth daily.     . ondansetron (ZOFRAN) 8 MG tablet Take 1 tablet (8 mg total) by mouth every 8 (eight) hours as needed for refractory nausea / vomiting. (Patient not taking: Reported on 08/16/2018) 30 tablet 1  . prochlorperazine (COMPAZINE) 10 MG tablet Take 1 tablet (10 mg total) by  mouth every 6 (six) hours as needed (Nausea or vomiting). (Patient not taking: Reported on 08/16/2018) 30 tablet 1  . traMADol (ULTRAM) 50 MG tablet Take 2 tablets (100 mg total) by mouth every 6 (six) hours as needed. 60 tablet 0   No current facility-administered medications for this visit.    Facility-Administered Medications Ordered in Other Visits  Medication Dose Route Frequency Provider Last Rate Last Dose  . CARBOplatin (PARAPLATIN) 490 mg in sodium chloride 0.9 % 250 mL chemo infusion  490 mg Intravenous Once Alvy Bimler, Quitman Norberto, MD      . famotidine (PEPCID) IVPB 20 mg premix  20 mg Intravenous Once Alvy Bimler, Deionte Spivack, MD      . fosaprepitant (EMEND) 150 mg, dexamethasone (DECADRON) 12 mg in sodium chloride 0.9 % 145 mL IVPB   Intravenous Once Alvy Bimler, Rim Thatch, MD      . heparin lock flush 100 unit/mL  500 Units Intracatheter Once PRN Alvy Bimler, Tristian Bouska, MD      . PACLitaxel (TAXOL) 234 mg in sodium chloride 0.9 % 250 mL chemo infusion (> 94m/m2)  140 mg/m2 (Treatment Plan Recorded) Intravenous Once Naava Janeway, MD      . sodium chloride flush (NS) 0.9 % injection 10 mL  10 mL Intracatheter PRN GAlvy Bimler Khiyan Crace, MD        PHYSICAL EXAMINATION: ECOG PERFORMANCE STATUS: 1 - Symptomatic but completely ambulatory  Vitals:  09/13/18 0914  BP: (!) 148/78  Pulse: 85  Resp: 18  Temp: (!) 97.1 F (36.2 C)  SpO2: 99%   Filed Weights   09/13/18 0914  Weight: 152 lb 12.8 oz (69.3 kg)    GENERAL:alert, no distress and comfortable SKIN: skin color, texture, turgor are normal, no rashes or significant lesions EYES: normal, Conjunctiva are pink and non-injected, sclera clear OROPHARYNX:no exudate, no erythema and lips, buccal mucosa, and tongue normal  NECK: supple, thyroid normal size, non-tender, without nodularity LYMPH:  no palpable lymphadenopathy in the cervical, axillary or inguinal LUNGS: clear to auscultation and percussion with normal breathing effort HEART: regular rate & rhythm and no murmurs and no  lower extremity edema ABDOMEN:abdomen soft, non-tender and normal bowel sounds Musculoskeletal:no cyanosis of digits and no clubbing  NEURO: alert & oriented x 3 with fluent speech, no focal motor/sensory deficits  LABORATORY DATA:  I have reviewed the data as listed    Component Value Date/Time   NA 142 09/13/2018 0848   K 3.1 (L) 09/13/2018 0848   CL 107 09/13/2018 0848   CO2 20 (L) 09/13/2018 0848   GLUCOSE 220 (H) 09/13/2018 0848   BUN 19 09/13/2018 0848   CREATININE 0.76 09/13/2018 0848   CALCIUM 9.4 09/13/2018 0848   PROT 7.6 09/13/2018 0848   ALBUMIN 3.5 09/13/2018 0848   AST 19 09/13/2018 0848   ALT 25 09/13/2018 0848   ALKPHOS 95 09/13/2018 0848   BILITOT <0.2 (L) 09/13/2018 0848   GFRNONAA >60 09/13/2018 0848   GFRAA >60 09/13/2018 0848    No results found for: SPEP, UPEP  Lab Results  Component Value Date   WBC 4.8 09/13/2018   NEUTROABS 3.9 09/13/2018   HGB 10.8 (L) 09/13/2018   HCT 34.5 (L) 09/13/2018   MCV 91.0 09/13/2018   PLT 264 09/13/2018      Chemistry      Component Value Date/Time   NA 142 09/13/2018 0848   K 3.1 (L) 09/13/2018 0848   CL 107 09/13/2018 0848   CO2 20 (L) 09/13/2018 0848   BUN 19 09/13/2018 0848   CREATININE 0.76 09/13/2018 0848      Component Value Date/Time   CALCIUM 9.4 09/13/2018 0848   ALKPHOS 95 09/13/2018 0848   AST 19 09/13/2018 0848   ALT 25 09/13/2018 0848   BILITOT <0.2 (L) 09/13/2018 0848       RADIOGRAPHIC STUDIES: I have personally reviewed the radiological images as listed and agreed with the findings in the report. Ct Chest W Contrast  Result Date: 08/15/2018 CLINICAL DATA:  Newly diagnosed endometrial carcinoma. Status hysterectomy 3 weeks ago. Staging. EXAM: CT CHEST, ABDOMEN, AND PELVIS WITH CONTRAST TECHNIQUE: Multidetector CT imaging of the chest, abdomen and pelvis was performed following the standard protocol during bolus administration of intravenous contrast. CONTRAST:  164m OMNIPAQUE IOHEXOL  300 MG/ML  SOLN COMPARISON:  None. FINDINGS: CT CHEST FINDINGS Cardiovascular: No acute findings. Mediastinum/Lymph Nodes: No masses or pathologically enlarged lymph nodes identified. Lungs/Pleura: No pulmonary infiltrate or mass identified. No effusion present. Musculoskeletal:  No suspicious bone lesions identified. CT ABDOMEN AND PELVIS FINDINGS Hepatobiliary: No masses identified. Gallbladder is unremarkable. Pancreas:  No mass or inflammatory changes. Spleen:  Within normal limits in size and appearance. Adrenals/Urinary tract:  No masses or hydronephrosis. Stomach/Bowel: Moderate to large hiatal hernia. No evidence of obstruction, inflammatory process, or abnormal fluid collections. Diverticulosis is seen mainly involving the sigmoid colon, however there is no evidence of diverticulitis. Vascular/Lymphatic: No pathologically enlarged  lymph nodes identified. An aortocaval lymph node measuring 8 mm is noted image 69/2. No abdominal aortic aneurysm. Reproductive: Expected postop changes from hysterectomy. No pelvic mass or abnormal fluid collections identified. Other:  None. Musculoskeletal: No suspicious bone lesions identified. Old benign appearing fracture deformities seen involving the right superior and inferior pubic rami. IMPRESSION: 1. Single indeterminate 8 mm aorto-caval retroperitoneal lymph node. No other signs metastatic disease within the chest, abdomen, or pelvis. 2. Moderate to large hiatal hernia. 3. Colonic diverticulosis. No radiographic evidence of diverticulitis. Electronically Signed   By: Marlaine Hind M.D.   On: 08/15/2018 08:39   Ct Abdomen Pelvis W Contrast  Result Date: 08/15/2018 CLINICAL DATA:  Newly diagnosed endometrial carcinoma. Status hysterectomy 3 weeks ago. Staging. EXAM: CT CHEST, ABDOMEN, AND PELVIS WITH CONTRAST TECHNIQUE: Multidetector CT imaging of the chest, abdomen and pelvis was performed following the standard protocol during bolus administration of intravenous  contrast. CONTRAST:  129m OMNIPAQUE IOHEXOL 300 MG/ML  SOLN COMPARISON:  None. FINDINGS: CT CHEST FINDINGS Cardiovascular: No acute findings. Mediastinum/Lymph Nodes: No masses or pathologically enlarged lymph nodes identified. Lungs/Pleura: No pulmonary infiltrate or mass identified. No effusion present. Musculoskeletal:  No suspicious bone lesions identified. CT ABDOMEN AND PELVIS FINDINGS Hepatobiliary: No masses identified. Gallbladder is unremarkable. Pancreas:  No mass or inflammatory changes. Spleen:  Within normal limits in size and appearance. Adrenals/Urinary tract:  No masses or hydronephrosis. Stomach/Bowel: Moderate to large hiatal hernia. No evidence of obstruction, inflammatory process, or abnormal fluid collections. Diverticulosis is seen mainly involving the sigmoid colon, however there is no evidence of diverticulitis. Vascular/Lymphatic: No pathologically enlarged lymph nodes identified. An aortocaval lymph node measuring 8 mm is noted image 69/2. No abdominal aortic aneurysm. Reproductive: Expected postop changes from hysterectomy. No pelvic mass or abnormal fluid collections identified. Other:  None. Musculoskeletal: No suspicious bone lesions identified. Old benign appearing fracture deformities seen involving the right superior and inferior pubic rami. IMPRESSION: 1. Single indeterminate 8 mm aorto-caval retroperitoneal lymph node. No other signs metastatic disease within the chest, abdomen, or pelvis. 2. Moderate to large hiatal hernia. 3. Colonic diverticulosis. No radiographic evidence of diverticulitis. Electronically Signed   By: JMarlaine HindM.D.   On: 08/15/2018 08:39   Ir Imaging Guided Port Insertion  Result Date: 08/22/2018 INDICATION: 68year old female with a history of gynecologic malignancy, referred for port catheter EXAM: IMPLANTED PORT A CATH PLACEMENT WITH ULTRASOUND AND FLUOROSCOPIC GUIDANCE MEDICATIONS: 900 mg clindamycin; The antibiotic was administered within an  appropriate time interval prior to skin puncture. ANESTHESIA/SEDATION: Moderate (conscious) sedation was employed during this procedure. A total of Versed 2.0 mg and Fentanyl 50 mcg was administered intravenously. Moderate Sedation Time: 18 minutes. The patient's level of consciousness and vital signs were monitored continuously by radiology nursing throughout the procedure under my direct supervision. FLUOROSCOPY TIME:  0 minutes, 6 seconds (1 mGy) COMPLICATIONS: None PROCEDURE: The procedure, risks, benefits, and alternatives were explained to the patient. Questions regarding the procedure were encouraged and answered. The patient understands and consents to the procedure. Ultrasound survey was performed with images stored and sent to PACs. The right neck and chest was prepped with chlorhexidine, and draped in the usual sterile fashion using maximum barrier technique (cap and mask, sterile gown, sterile gloves, large sterile sheet, hand hygiene and cutaneous antiseptic). Antibiotic prophylaxis was provided with 2.0g Ancef administered IV one hour prior to skin incision. Local anesthesia was attained by infiltration with 1% lidocaine without epinephrine. Ultrasound demonstrated patency of the right internal jugular  vein, and this was documented with an image. Under real-time ultrasound guidance, this vein was accessed with a 21 gauge micropuncture needle and image documentation was performed. A small dermatotomy was made at the access site with an 11 scalpel. A 0.018" wire was advanced into the SVC and used to estimate the length of the internal catheter. The access needle exchanged for a 76F micropuncture vascular sheath. The 0.018" wire was then removed and a 0.035" wire advanced into the IVC. An appropriate location for the subcutaneous reservoir was selected below the clavicle and an incision was made through the skin and underlying soft tissues. The subcutaneous tissues were then dissected using a combination  of blunt and sharp surgical technique and a pocket was formed. A single lumen power injectable portacatheter was then tunneled through the subcutaneous tissues from the pocket to the dermatotomy and the port reservoir placed within the subcutaneous pocket. The venous access site was then serially dilated and a peel away vascular sheath placed over the wire. The wire was removed and the port catheter advanced into position under fluoroscopic guidance. The catheter tip is positioned in the cavoatrial junction. This was documented with a spot image. The portacatheter was then tested and found to flush and aspirate well. The port was flushed with saline followed by 100 units/mL heparinized saline. The pocket was then closed in two layers using first subdermal inverted interrupted absorbable sutures followed by a running subcuticular suture. The epidermis was then sealed with Dermabond. The dermatotomy at the venous access site was also seal with Dermabond. Patient tolerated the procedure well and remained hemodynamically stable throughout. No complications encountered and no significant blood loss encountered IMPRESSION: Status post right IJ port catheter placement. Signed, Dulcy Fanny. Dellia Nims, RPVI [facility] Electronically Signed   By: Corrie Mckusick D.O.   On: 08/22/2018 10:20    All questions were answered. The patient knows to call the clinic with any problems, questions or concerns. No barriers to learning was detected.  I spent 15 minutes counseling the patient face to face. The total time spent in the appointment was 20 minutes and more than 50% was on counseling and review of test results  Heath Lark, MD 09/13/2018 10:02 AM

## 2018-09-13 NOTE — Assessment & Plan Note (Signed)
She has significant arthralgias after treatment Recommend conservative approach She felt that tramadol was helpful I refill her prescription

## 2018-09-13 NOTE — Assessment & Plan Note (Signed)
she has mild peripheral neuropathy, likely related to side effects of treatment. It is only mild, not bothering the patient. I will observe for now If it gets worse in the future, I will consider modifying the dose of the treatment  

## 2018-09-13 NOTE — Patient Instructions (Signed)
[facility] Discharge Instructions for Patients Receiving Chemotherapy  Today you received the following chemotherapy agents Paclitaxel (TAXOL) & Carboplatin (PARAPLATIN).  To help prevent nausea and vomiting after your treatment, we encourage you to take your nausea medication as prescribed.  If you develop nausea and vomiting that is not controlled by your nausea medication, call the clinic.   BELOW ARE SYMPTOMS THAT SHOULD BE REPORTED IMMEDIATELY:  *FEVER GREATER THAN 100.5 F  *CHILLS WITH OR WITHOUT FEVER  NAUSEA AND VOMITING THAT IS NOT CONTROLLED WITH YOUR NAUSEA MEDICATION  *UNUSUAL SHORTNESS OF BREATH  *UNUSUAL BRUISING OR BLEEDING  TENDERNESS IN MOUTH AND THROAT WITH OR WITHOUT PRESENCE OF ULCERS  *URINARY PROBLEMS  *BOWEL PROBLEMS  UNUSUAL RASH Items with * indicate a potential emergency and should be followed up as soon as possible.  Feel free to call the clinic should you have any questions or concerns. The clinic phone number is [phone].  Please show the CHEMO ALERT CARD at check-in to the Emergency Department and triage nurse.  Paclitaxel injection What is this medicine? PACLITAXEL (PAK li TAX el) is a chemotherapy drug. It targets fast dividing cells, like cancer cells, and causes these cells to die. This medicine is used to treat ovarian cancer, breast cancer, lung cancer, Kaposi's sarcoma, and other cancers. This medicine may be used for other purposes; ask your health care provider or pharmacist if you have questions. COMMON BRAND NAME(S): Onxol, Taxol What should I tell my health care provider before I take this medicine? They need to know if you have any of these conditions:  history of irregular heartbeat  liver disease  low blood counts, like low white cell, platelet, or red cell counts  lung or breathing disease, like asthma  tingling of the fingers or toes, or other nerve disorder  an unusual or allergic reaction to  paclitaxel, alcohol, polyoxyethylated castor oil, other chemotherapy, other medicines, foods, dyes, or preservatives  pregnant or trying to get pregnant  breast-feeding How should I use this medicine? This drug is given as an infusion into a vein. It is administered in a hospital or clinic by a specially trained health care professional. Talk to your pediatrician regarding the use of this medicine in children. Special care may be needed. Overdosage: If you think you have taken too much of this medicine contact a poison control center or emergency room at once. NOTE: This medicine is only for you. Do not share this medicine with others. What if I miss a dose? It is important not to miss your dose. Call your doctor or health care professional if you are unable to keep an appointment. What may interact with this medicine? Do not take this medicine with any of the following medications:  disulfiram  metronidazole This medicine may also interact with the following medications:  antiviral medicines for hepatitis, HIV or AIDS  certain antibiotics like erythromycin and clarithromycin  certain medicines for fungal infections like ketoconazole and itraconazole  certain medicines for seizures like carbamazepine, phenobarbital, phenytoin  gemfibrozil  nefazodone  rifampin  St. John's wort This list may not describe all possible interactions. Give your health care provider a list of all the medicines, herbs, non-prescription drugs, or dietary supplements you use. Also tell them if you smoke, drink alcohol, or use illegal drugs. Some items may interact with your medicine. What should I watch for while using this medicine? Your condition will be monitored carefully while you are receiving this medicine. You will need   important blood work done while you are taking this medicine. This medicine can cause serious allergic reactions. To reduce your risk you will need to take other medicine(s)  before treatment with this medicine. If you experience allergic reactions like skin rash, itching or hives, swelling of the face, lips, or tongue, tell your doctor or health care professional right away. In some cases, you may be given additional medicines to help with side effects. Follow all directions for their use. This drug may make you feel generally unwell. This is not uncommon, as chemotherapy can affect healthy cells as well as cancer cells. Report any side effects. Continue your course of treatment even though you feel ill unless your doctor tells you to stop. Call your doctor or health care professional for advice if you get a fever, chills or sore throat, or other symptoms of a cold or flu. Do not treat yourself. This drug decreases your body's ability to fight infections. Try to avoid being around people who are sick. This medicine may increase your risk to bruise or bleed. Call your doctor or health care professional if you notice any unusual bleeding. Be careful brushing and flossing your teeth or using a toothpick because you may get an infection or bleed more easily. If you have any dental work done, tell your dentist you are receiving this medicine. Avoid taking products that contain aspirin, acetaminophen, ibuprofen, naproxen, or ketoprofen unless instructed by your doctor. These medicines may hide a fever. Do not become pregnant while taking this medicine. Women should inform their doctor if they wish to become pregnant or think they might be pregnant. There is a potential for serious side effects to an unborn child. Talk to your health care professional or pharmacist for more information. Do not breast-feed an infant while taking this medicine. Men are advised not to father a child while receiving this medicine. This product may contain alcohol. Ask your pharmacist or healthcare provider if this medicine contains alcohol. Be sure to tell all healthcare providers you are taking this  medicine. Certain medicines, like metronidazole and disulfiram, can cause an unpleasant reaction when taken with alcohol. The reaction includes flushing, headache, nausea, vomiting, sweating, and increased thirst. The reaction can last from 30 minutes to several hours. What side effects may I notice from receiving this medicine? Side effects that you should report to your doctor or health care professional as soon as possible:  allergic reactions like skin rash, itching or hives, swelling of the face, lips, or tongue  breathing problems  changes in vision  fast, irregular heartbeat  high or low blood pressure  mouth sores  pain, tingling, numbness in the hands or feet  signs of decreased platelets or bleeding - bruising, pinpoint red spots on the skin, black, tarry stools, blood in the urine  signs of decreased red blood cells - unusually weak or tired, feeling faint or lightheaded, falls  signs of infection - fever or chills, cough, sore throat, pain or difficulty passing urine  signs and symptoms of liver injury like dark yellow or brown urine; general ill feeling or flu-like symptoms; light-colored stools; loss of appetite; nausea; right upper belly pain; unusually weak or tired; yellowing of the eyes or skin  swelling of the ankles, feet, hands  unusually slow heartbeat Side effects that usually do not require medical attention (report to your doctor or health care professional if they continue or are bothersome):  diarrhea  hair loss  loss of appetite  muscle or   joint pain  nausea, vomiting  pain, redness, or irritation at site where injected  tiredness This list may not describe all possible side effects. Call your doctor for medical advice about side effects. You may report side effects to FDA at 1-800-FDA-1088. Where should I keep my medicine? This drug is given in a hospital or clinic and will not be stored at home. NOTE: This sheet is a summary. It may not  cover all possible information. If you have questions about this medicine, talk to your doctor, pharmacist, or health care provider.  2020 Elsevier/Gold Standard (2016-09-21 13:14:55)  Carboplatin injection What is this medicine? CARBOPLATIN (KAR boe pla tin) is a chemotherapy drug. It targets fast dividing cells, like cancer cells, and causes these cells to die. This medicine is used to treat ovarian cancer and many other cancers. This medicine may be used for other purposes; ask your health care provider or pharmacist if you have questions. COMMON BRAND NAME(S): Paraplatin What should I tell my health care provider before I take this medicine? They need to know if you have any of these conditions:  blood disorders  hearing problems  kidney disease  recent or ongoing radiation therapy  an unusual or allergic reaction to carboplatin, cisplatin, other chemotherapy, other medicines, foods, dyes, or preservatives  pregnant or trying to get pregnant  breast-feeding How should I use this medicine? This drug is usually given as an infusion into a vein. It is administered in a hospital or clinic by a specially trained health care professional. Talk to your pediatrician regarding the use of this medicine in children. Special care may be needed. Overdosage: If you think you have taken too much of this medicine contact a poison control center or emergency room at once. NOTE: This medicine is only for you. Do not share this medicine with others. What if I miss a dose? It is important not to miss a dose. Call your doctor or health care professional if you are unable to keep an appointment. What may interact with this medicine?  medicines for seizures  medicines to increase blood counts like filgrastim, pegfilgrastim, sargramostim  some antibiotics like amikacin, gentamicin, neomycin, streptomycin, tobramycin  vaccines Talk to your doctor or health care professional before taking any of  these medicines:  acetaminophen  aspirin  ibuprofen  ketoprofen  naproxen This list may not describe all possible interactions. Give your health care provider a list of all the medicines, herbs, non-prescription drugs, or dietary supplements you use. Also tell them if you smoke, drink alcohol, or use illegal drugs. Some items may interact with your medicine. What should I watch for while using this medicine? Your condition will be monitored carefully while you are receiving this medicine. You will need important blood work done while you are taking this medicine. This drug may make you feel generally unwell. This is not uncommon, as chemotherapy can affect healthy cells as well as cancer cells. Report any side effects. Continue your course of treatment even though you feel ill unless your doctor tells you to stop. In some cases, you may be given additional medicines to help with side effects. Follow all directions for their use. Call your doctor or health care professional for advice if you get a fever, chills or sore throat, or other symptoms of a cold or flu. Do not treat yourself. This drug decreases your body's ability to fight infections. Try to avoid being around people who are sick. This medicine may increase your risk to   bruise or bleed. Call your doctor or health care professional if you notice any unusual bleeding. Be careful brushing and flossing your teeth or using a toothpick because you may get an infection or bleed more easily. If you have any dental work done, tell your dentist you are receiving this medicine. Avoid taking products that contain aspirin, acetaminophen, ibuprofen, naproxen, or ketoprofen unless instructed by your doctor. These medicines may hide a fever. Do not become pregnant while taking this medicine. Women should inform their doctor if they wish to become pregnant or think they might be pregnant. There is a potential for serious side effects to an unborn child.  Talk to your health care professional or pharmacist for more information. Do not breast-feed an infant while taking this medicine. What side effects may I notice from receiving this medicine? Side effects that you should report to your doctor or health care professional as soon as possible:  allergic reactions like skin rash, itching or hives, swelling of the face, lips, or tongue  signs of infection - fever or chills, cough, sore throat, pain or difficulty passing urine  signs of decreased platelets or bleeding - bruising, pinpoint red spots on the skin, black, tarry stools, nosebleeds  signs of decreased red blood cells - unusually weak or tired, fainting spells, lightheadedness  breathing problems  changes in hearing  changes in vision  chest pain  high blood pressure  low blood counts - This drug may decrease the number of white blood cells, red blood cells and platelets. You may be at increased risk for infections and bleeding.  nausea and vomiting  pain, swelling, redness or irritation at the injection site  pain, tingling, numbness in the hands or feet  problems with balance, talking, walking  trouble passing urine or change in the amount of urine Side effects that usually do not require medical attention (report to your doctor or health care professional if they continue or are bothersome):  hair loss  loss of appetite  metallic taste in the mouth or changes in taste This list may not describe all possible side effects. Call your doctor for medical advice about side effects. You may report side effects to FDA at 1-800-FDA-1088. Where should I keep my medicine? This drug is given in a hospital or clinic and will not be stored at home. NOTE: This sheet is a summary. It may not cover all possible information. If you have questions about this medicine, talk to your doctor, pharmacist, or health care provider.  2020 Elsevier/Gold Standard (2007-04-25  14:38:05)  Coronavirus (COVID-19) Are you at risk?  Are you at risk for the Coronavirus (COVID-19)?  To be considered HIGH RISK for Coronavirus (COVID-19), you have to meet the following criteria:  . Traveled to [place], [place], [place], [place] or [place]; or in the [place] to [place], [place], [place], or [place]; and have fever, cough, and shortness of breath within the last 2 weeks of travel OR . Been in close contact with a person diagnosed with COVID-19 within the last 2 weeks and have fever, cough, and shortness of breath . IF YOU DO NOT MEET THESE CRITERIA, YOU ARE CONSIDERED LOW RISK FOR COVID-19.  What to do if you are HIGH RISK for COVID-19?  . If you are having a medical emergency, call 911. . Seek medical care right away. Before you go to a doctor's office, urgent care or emergency department, call ahead and tell them about your recent travel, contact   with someone diagnosed with COVID-19, and your symptoms. You should receive instructions from your physician's office regarding next steps of care.  . When you arrive at healthcare provider, tell the healthcare staff immediately you have returned from visiting [place], [place], [place], [place] or [place]; or traveled in the [place] to [place], [place], [place], or [place]; in the last two weeks or you have been in close contact with a person diagnosed with COVID-19 in the last 2 weeks.   . Tell the health care staff about your symptoms: fever, cough and shortness of breath. . After you have been seen by a medical provider, you will be either: o Tested for (COVID-19) and discharged home on quarantine except to seek medical care if symptoms worsen, and asked to  - Stay home and avoid contact with others until you get your results (4-5 days)  - Avoid travel on public transportation if possible (such as bus, train, or airplane) or o Sent to the Emergency Department by EMS for evaluation, COVID-19 testing, and  possible admission depending on your condition and test results.  What to do if you are LOW RISK for COVID-19?  Reduce your risk of any infection by using the same precautions used for avoiding the common cold or flu:  . Wash your hands often with soap and warm water for at least 20 seconds.  If soap and water are not readily available, use an alcohol-based hand sanitizer with at least 60% alcohol.  . If coughing or sneezing, cover your mouth and nose by coughing or sneezing into the elbow areas of your shirt or coat, into a tissue or into your sleeve (not your hands). . Avoid shaking hands with others and consider head nods or verbal greetings only. . Avoid touching your eyes, nose, or mouth with unwashed hands.  . Avoid close contact with people who are sick. . Avoid places or events with large numbers of people in one location, like concerts or sporting events. . Carefully consider travel plans you have or are making. . If you are planning any travel outside or inside the [place], visit the CDC's Travelers' Health webpage for the latest health notices. . If you have some symptoms but not all symptoms, continue to monitor at home and seek medical attention if your symptoms worsen. . If you are having a medical emergency, call 911.   ADDITIONAL HEALTHCARE OPTIONS FOR PATIENTS  [facility] / e-Visit: [email or web address]         [facility]: [phone]  [facility]: [phone]                   [facility]: [phone]   

## 2018-09-13 NOTE — Assessment & Plan Note (Signed)

## 2018-09-13 NOTE — Assessment & Plan Note (Signed)
So far, she tolerated treatment well with expected side effects including mild infusion reaction which was subsequently treated successfully, muscle aches and pain for 2 days after chemo, alopecia and very mild changes in bowel habits as well as minimum neuropathy We will proceed with treatment as scheduled She will also continue radiation therapy I will see her in 3 weeks for cycle 3 of therapy

## 2018-09-15 ENCOUNTER — Telehealth: Payer: Self-pay | Admitting: *Deleted

## 2018-09-15 NOTE — Telephone Encounter (Signed)
Called patient to remind of HDR Tx. For 09-18-18 @ 9 am, spoke with patient and she is aware of this tx.

## 2018-09-18 ENCOUNTER — Ambulatory Visit
Admission: RE | Admit: 2018-09-18 | Discharge: 2018-09-18 | Disposition: A | Discharge status: Home / Self Care | Payer: Medicare Other | Source: Ambulatory Visit | Attending: Radiation Oncology | Admitting: Radiation Oncology

## 2018-09-18 ENCOUNTER — Other Ambulatory Visit: Payer: Self-pay

## 2018-09-18 DIAGNOSIS — C541 Malignant neoplasm of endometrium: Secondary | ICD-10-CM

## 2018-09-18 DIAGNOSIS — Z510 Encounter for antineoplastic radiation therapy: Secondary | ICD-10-CM | POA: Diagnosis not present

## 2018-09-18 NOTE — Progress Notes (Signed)
  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 She will be treated with a 3.0 cm diameter segmented cylinder. This conforms to her anatomy without undue discomfort.  Vaginal brachytherapy procedure node: The patient was brought to the [place] suite. Identity was confirmed. All relevant records and images related to the planned course of therapy were reviewed. The patient freely provided informed written consent to proceed with treatment after reviewing the details related to the planned course of therapy. The consent form was witnessed and verified by the simulation staff. Then, the patient was set-up in a stable reproducible supine position for radiation therapy. Pelvic exam revealed the vaginal cuff to be intact. The patient's custom vaginal cylinder was placed in the proximal vagina. This was affixed to the CT/MR stabilization plate to prevent slippage. Patient tolerated the placement well.  Verification simulation note:  A fiducial marker was placed within the vaginal cylinder. An AP and lateral film was then obtained through the pelvis area. This documented accurate position of the vaginal cylinder for treatment.  HDR BRACHYTHERAPY TREATMENT  The remote afterloading device was affixed to the vaginal cylinder by catheter. Patient then proceeded to undergo her third high-dose-rate treatment directed at the proximal vagina. The patient was prescribed a dose of 6 gray to be delivered to the mucosal surface. Treatment length was 3.0 cm. Patient was treated with 1 channel using 8 dwell positions. Treatment time was 265.7 seconds. Iridium 192  was the high-dose-rate source for treatment. The patient tolerated the treatment well. After completion of her therapy, a radiation survey was performed documenting return of the iridium source into the GammaMed safe.   PLAN: Patient will return next week for her fourth HDR treatment.  ________________________________  Blair Promise, PhD, MD    This document serves as a record of services personally performed by Gery Pray, MD. It was created on his behalf by Wilburn Mylar, a trained medical scribe. The creation of this record is based on the scribe's personal observations and the provider's statements to them. This document has been checked and approved by the attending provider.

## 2018-09-22 ENCOUNTER — Telehealth: Payer: Self-pay | Admitting: *Deleted

## 2018-09-22 NOTE — Telephone Encounter (Signed)
Called patient to remind of HDR Tx. for 09-25-18 @ 9 am, spoke with patient and she is aware of this tx.

## 2018-09-25 ENCOUNTER — Other Ambulatory Visit: Payer: Self-pay

## 2018-09-25 ENCOUNTER — Ambulatory Visit
Admission: RE | Admit: 2018-09-25 | Discharge: 2018-09-25 | Disposition: A | Discharge status: Home / Self Care | Payer: Medicare Other | Source: Ambulatory Visit | Attending: Radiation Oncology | Admitting: Radiation Oncology

## 2018-09-25 DIAGNOSIS — C541 Malignant neoplasm of endometrium: Secondary | ICD-10-CM

## 2018-09-25 DIAGNOSIS — Z510 Encounter for antineoplastic radiation therapy: Secondary | ICD-10-CM | POA: Diagnosis not present

## 2018-09-25 NOTE — Progress Notes (Signed)
  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 She will be treated with a 3.0 cm diameter segmented cylinder. This conforms to her anatomy without undue discomfort.  Vaginal brachytherapy procedure node: The patient was brought to the [place] suite. Identity was confirmed. All relevant records and images related to the planned course of therapy were reviewed. The patient freely provided informed written consent to proceed with treatment after reviewing the details related to the planned course of therapy. The consent form was witnessed and verified by the simulation staff. Then, the patient was set-up in a stable reproducible supine position for radiation therapy. Pelvic exam revealed the vaginal cuff to be intact. The patient's custom vaginal cylinder was placed in the proximal vagina. This was affixed to the CT/MR stabilization plate to prevent slippage. Patient tolerated the placement well.  Verification simulation note:  A fiducial marker was placed within the vaginal cylinder. An AP and lateral film was then obtained through the pelvis area. This documented accurate position of the vaginal cylinder for treatment.  HDR BRACHYTHERAPY TREATMENT  The remote afterloading device was affixed to the vaginal cylinder by catheter. Patient then proceeded to undergo her fourth high-dose-rate treatment directed at the proximal vagina. The patient was prescribed a dose of 6 gray to be delivered to the mucosal surface. Treatment length was 3.0 cm. Patient was treated with 1 channel using 8 dwell positions. Treatment time was 283.7 seconds. Iridium 192  was the high-dose-rate source for treatment. The patient tolerated the treatment well. After completion of her therapy, a radiation survey was performed documenting return of the iridium source into the GammaMed safe.   PLAN: Patient will return next week for her fifth and final HDR treatment.  ________________________________  Blair Promise, PhD, MD    This document serves as a reord of services personally performed by Gery Pray, MD. It was created on his behalf by Wilburn Mylar, a trained medical scribe. The creation of this record is based on the scribe's personal observations and the provider's statements to them. This document has been checked and approved by the attending provider.

## 2018-09-29 ENCOUNTER — Telehealth: Payer: Self-pay | Admitting: *Deleted

## 2018-09-29 NOTE — Telephone Encounter (Signed)
CALLED PATIENT TO REMIND OF HDR [place] 10-02-18 @ 10 AM, SPOKE WITH PATIENT AND SHE IS AWARE OF THIS [place].

## 2018-10-02 ENCOUNTER — Ambulatory Visit
Admission: RE | Admit: 2018-10-02 | Discharge: 2018-10-02 | Disposition: A | Discharge status: Home / Self Care | Payer: Medicare Other | Source: Ambulatory Visit | Attending: Radiation Oncology | Admitting: Radiation Oncology

## 2018-10-02 ENCOUNTER — Other Ambulatory Visit: Payer: Self-pay

## 2018-10-02 ENCOUNTER — Encounter: Payer: Self-pay | Admitting: Radiation Oncology

## 2018-10-02 DIAGNOSIS — C541 Malignant neoplasm of endometrium: Secondary | ICD-10-CM

## 2018-10-02 DIAGNOSIS — Z510 Encounter for antineoplastic radiation therapy: Secondary | ICD-10-CM | POA: Diagnosis not present

## 2018-10-02 NOTE — Progress Notes (Signed)
  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 She will be treated with a 3.0 cm diameter segmented cylinder. This conforms to her anatomy without undue discomfort.  Vaginal brachytherapy procedure node: The patient was brought to the [facility]. Identity was confirmed. All relevant records and images related to the planned course of therapy were reviewed. The patient freely provided informed written consent to proceed with treatment after reviewing the details related to the planned course of therapy. The consent form was witnessed and verified by the simulation staff. Then, the patient was set-up in a stable reproducible supine position for radiation therapy. Pelvic exam revealed the vaginal cuff to be intact. The patient's custom vaginal cylinder was placed in the proximal vagina. This was affixed to the CT/MR stabilization plate to prevent slippage. Patient tolerated the placement well.  Verification simulation note:  A fiducial marker was placed within the vaginal cylinder. An AP and lateral film was then obtained through the pelvis area. This documented accurate position of the vaginal cylinder for treatment.  HDR BRACHYTHERAPY TREATMENT  The remote afterloading device was affixed to the vaginal cylinder by catheter. Patient then proceeded to undergo her fifth high-dose-rate treatment directed at the proximal vagina. The patient was prescribed a dose of 6 gray to be delivered to the mucosal surface. Treatment length was 3.0 cm. Patient was treated with 1 channel using 8 dwell positions. Treatment time was 303.0 seconds. Iridium 192  was the high-dose-rate source for treatment. The patient tolerated the treatment well. After completion of her therapy, a radiation survey was performed documenting return of the iridium source into the [employer] safe.   PLAN: The patient has completed her vaginal brachii therapy treatments.  She will be scheduled for routine follow-up in 1 month.  She will continue with additional chemotherapy and is scheduled for another cycle of chemotherapy later this week.  ________________________________  Blair Promise, PhD, MD    This document serves as a reord of services personally performed by Gery Pray, MD. It was created on his behalf by Wilburn Mylar, a trained medical scribe. The creation of this record is based on the scribe's personal observations and the provider's statements to them. This document has been checked and approved by the attending provider.

## 2018-10-04 ENCOUNTER — Inpatient Hospital Stay (HOSPITAL_BASED_OUTPATIENT_CLINIC_OR_DEPARTMENT_OTHER): Payer: Medicare Other | Admitting: Hematology and Oncology

## 2018-10-04 ENCOUNTER — Other Ambulatory Visit: Payer: Self-pay

## 2018-10-04 ENCOUNTER — Inpatient Hospital Stay: Payer: Medicare Other | Attending: Gynecologic Oncology

## 2018-10-04 ENCOUNTER — Inpatient Hospital Stay: Payer: Medicare Other

## 2018-10-04 ENCOUNTER — Encounter: Payer: Self-pay | Admitting: Hematology and Oncology

## 2018-10-04 ENCOUNTER — Other Ambulatory Visit: Payer: Self-pay | Admitting: Hematology and Oncology

## 2018-10-04 ENCOUNTER — Encounter: Payer: Self-pay | Admitting: Oncology

## 2018-10-04 DIAGNOSIS — C541 Malignant neoplasm of endometrium: Secondary | ICD-10-CM | POA: Diagnosis not present

## 2018-10-04 DIAGNOSIS — G620 Drug-induced polyneuropathy: Secondary | ICD-10-CM | POA: Insufficient documentation

## 2018-10-04 DIAGNOSIS — Z923 Personal history of irradiation: Secondary | ICD-10-CM | POA: Insufficient documentation

## 2018-10-04 DIAGNOSIS — Z5111 Encounter for antineoplastic chemotherapy: Secondary | ICD-10-CM | POA: Insufficient documentation

## 2018-10-04 DIAGNOSIS — E876 Hypokalemia: Secondary | ICD-10-CM | POA: Diagnosis not present

## 2018-10-04 DIAGNOSIS — C549 Malignant neoplasm of corpus uteri, unspecified: Secondary | ICD-10-CM

## 2018-10-04 DIAGNOSIS — D61818 Other pancytopenia: Secondary | ICD-10-CM | POA: Insufficient documentation

## 2018-10-04 DIAGNOSIS — C55 Malignant neoplasm of uterus, part unspecified: Secondary | ICD-10-CM | POA: Diagnosis present

## 2018-10-04 DIAGNOSIS — T451X5A Adverse effect of antineoplastic and immunosuppressive drugs, initial encounter: Secondary | ICD-10-CM

## 2018-10-04 LAB — CMP (CANCER CENTER ONLY)
ALT: 29 U/L (ref 0–44)
AST: 23 U/L (ref 15–41)
Albumin: 3.7 g/dL (ref 3.5–5.0)
Alkaline Phosphatase: 83 U/L (ref 38–126)
Anion gap: 15 (ref 5–15)
BUN: 14 mg/dL (ref 8–23)
CO2: 20 mmol/L — ABNORMAL LOW (ref 22–32)
Calcium: 8.3 mg/dL — ABNORMAL LOW (ref 8.9–10.3)
Chloride: 107 mmol/L (ref 98–111)
Creatinine: 0.72 mg/dL (ref 0.44–1.00)
GFR, Est AFR Am: >60 mL/min (ref >60)
GFR, Estimated: >60 mL/min (ref >60)
Glucose, Bld: 231 mg/dL — ABNORMAL HIGH (ref 70–99)
Potassium: 3 mmol/L — CL (ref 3.5–5.1)
Sodium: 142 mmol/L (ref 135–145)
Total Bilirubin: 0.2 mg/dL — ABNORMAL LOW (ref 0.3–1.2)
Total Protein: 7.1 g/dL (ref 6.5–8.1)

## 2018-10-04 LAB — CBC WITH DIFFERENTIAL (CANCER CENTER ONLY)
Abs Immature Granulocytes: 0.02 10*3/uL (ref 0.00–0.07)
Basophils Absolute: 0 10*3/uL (ref 0.0–0.1)
Basophils Relative: 0 %
Eosinophils Absolute: 0 10*3/uL (ref 0.0–0.5)
Eosinophils Relative: 0 %
HCT: 30.6 % — ABNORMAL LOW (ref 36.0–46.0)
Hemoglobin: 9.8 g/dL — ABNORMAL LOW (ref 12.0–15.0)
Immature Granulocytes: 1 %
Lymphocytes Relative: 25 %
Lymphs Abs: 0.6 10*3/uL — ABNORMAL LOW (ref 0.7–4.0)
MCH: 29.7 pg (ref 26.0–34.0)
MCHC: 32 g/dL (ref 30.0–36.0)
MCV: 92.7 fL (ref 80.0–100.0)
Monocytes Absolute: 0 10*3/uL — ABNORMAL LOW (ref 0.1–1.0)
Monocytes Relative: 1 %
Neutro Abs: 1.8 10*3/uL (ref 1.7–7.7)
Neutrophils Relative %: 73 %
Platelet Count: 122 10*3/uL — ABNORMAL LOW (ref 150–400)
RBC: 3.3 MIL/uL — ABNORMAL LOW (ref 3.87–5.11)
RDW: 17.3 % — ABNORMAL HIGH (ref 11.5–15.5)
WBC Count: 2.4 10*3/uL — ABNORMAL LOW (ref 4.0–10.5)
nRBC: 0 % (ref 0.0–0.2)

## 2018-10-04 MED ORDER — SODIUM CHLORIDE 0.9% FLUSH
10.0000 mL | INTRAVENOUS | Status: DC | PRN
  Administered 2018-10-04: 10 mL
  Filled 2018-10-04: qty 10

## 2018-10-04 MED ORDER — DEXAMETHASONE 4 MG PO TABS: ORAL_TABLET | ORAL | 11 refills | Status: DC

## 2018-10-04 MED ORDER — DIPHENHYDRAMINE HCL 50 MG/ML IJ SOLN
50.0000 mg | Freq: Once | INTRAMUSCULAR | Status: AC
  Administered 2018-10-04: 10:00:00 50 mg via INTRAVENOUS

## 2018-10-04 MED ORDER — HEPARIN SOD (PORK) LOCK FLUSH 100 UNIT/ML IV SOLN
500.0000 [IU] | Freq: Once | INTRAVENOUS | Status: AC | PRN
  Administered 2018-10-04: 500 [IU]
  Filled 2018-10-04: qty 5

## 2018-10-04 MED ORDER — FAMOTIDINE IN NACL 20-0.9 MG/50ML-% IV SOLN
INTRAVENOUS | Status: AC
  Filled 2018-10-04: qty 50

## 2018-10-04 MED ORDER — SODIUM CHLORIDE 0.9 % IV SOLN
Freq: Once | INTRAVENOUS | Status: AC
  Administered 2018-10-04: 10:00:00 via INTRAVENOUS
  Filled 2018-10-04: qty 5

## 2018-10-04 MED ORDER — DIPHENHYDRAMINE HCL 50 MG/ML IJ SOLN
INTRAMUSCULAR | Status: AC
  Filled 2018-10-04: qty 1

## 2018-10-04 MED ORDER — SODIUM CHLORIDE 0.9 % IV SOLN
Freq: Once | INTRAVENOUS | Status: AC
  Administered 2018-10-04: 10:00:00 via INTRAVENOUS
  Filled 2018-10-04: qty 250

## 2018-10-04 MED ORDER — PALONOSETRON HCL INJECTION 0.25 MG/5ML
0.2500 mg | Freq: Once | INTRAVENOUS | Status: AC
  Administered 2018-10-04: 0.25 mg via INTRAVENOUS

## 2018-10-04 MED ORDER — SODIUM CHLORIDE 0.9 % IV SOLN
140.0000 mg/m2 | Freq: Once | INTRAVENOUS | Status: AC
  Administered 2018-10-04: 234 mg via INTRAVENOUS
  Filled 2018-10-04: qty 39

## 2018-10-04 MED ORDER — FAMOTIDINE IN NACL 20-0.9 MG/50ML-% IV SOLN
20.0000 mg | Freq: Once | INTRAVENOUS | Status: AC
  Administered 2018-10-04: 20 mg via INTRAVENOUS

## 2018-10-04 MED ORDER — SODIUM CHLORIDE 0.9% FLUSH
10.0000 mL | Freq: Once | INTRAVENOUS | Status: AC
  Administered 2018-10-04: 08:00:00 10 mL
  Filled 2018-10-04: qty 10

## 2018-10-04 MED ORDER — SODIUM CHLORIDE 0.9 % IV SOLN
494.4000 mg | Freq: Once | INTRAVENOUS | Status: AC
  Administered 2018-10-04: 490 mg via INTRAVENOUS
  Filled 2018-10-04: qty 49

## 2018-10-04 MED ORDER — PALONOSETRON HCL INJECTION 0.25 MG/5ML
INTRAVENOUS | Status: AC
  Filled 2018-10-04: qty 5

## 2018-10-04 MED FILL — LIDOCAINE-PRILOCAINE CREAM: 2.5-2.5 | 10 days supply | Qty: 30 | Fill #1

## 2018-10-04 MED FILL — PROCHLORPERAZINE 10 MG TAB: 10 | 7 days supply | Qty: 30 | Fill #1

## 2018-10-04 NOTE — Assessment & Plan Note (Signed)
she has mild peripheral neuropathy, likely related to side effects of treatment. It is only mild, not bothering the patient. I will observe for now If it gets worse in the future, I will consider modifying the dose of the treatment  

## 2018-10-04 NOTE — Patient Instructions (Signed)
[facility] Discharge Instructions for Patients Receiving Chemotherapy  Today you received the following chemotherapy agents Paclitaxel (TAXOL) & Carboplatin (PARAPLATIN).  To help prevent nausea and vomiting after your treatment, we encourage you to take your nausea medication as prescribed.  If you develop nausea and vomiting that is not controlled by your nausea medication, call the clinic.   BELOW ARE SYMPTOMS THAT SHOULD BE REPORTED IMMEDIATELY:  *FEVER GREATER THAN 100.5 F  *CHILLS WITH OR WITHOUT FEVER  NAUSEA AND VOMITING THAT IS NOT CONTROLLED WITH YOUR NAUSEA MEDICATION  *UNUSUAL SHORTNESS OF BREATH  *UNUSUAL BRUISING OR BLEEDING  TENDERNESS IN MOUTH AND THROAT WITH OR WITHOUT PRESENCE OF ULCERS  *URINARY PROBLEMS  *BOWEL PROBLEMS  UNUSUAL RASH Items with * indicate a potential emergency and should be followed up as soon as possible.  Feel free to call the clinic should you have any questions or concerns. The clinic phone number is [phone].  Please show the CHEMO ALERT CARD at check-in to the Emergency Department and triage nurse.  Paclitaxel injection What is this medicine? PACLITAXEL (PAK li TAX el) is a chemotherapy drug. It targets fast dividing cells, like cancer cells, and causes these cells to die. This medicine is used to treat ovarian cancer, breast cancer, lung cancer, Kaposi's sarcoma, and other cancers. This medicine may be used for other purposes; ask your health care provider or pharmacist if you have questions. COMMON BRAND NAME(S): Onxol, Taxol What should I tell my health care provider before I take this medicine? They need to know if you have any of these conditions:  history of irregular heartbeat  liver disease  low blood counts, like low white cell, platelet, or red cell counts  lung or breathing disease, like asthma  tingling of the fingers or toes, or other nerve disorder  an unusual or allergic reaction to  paclitaxel, alcohol, polyoxyethylated castor oil, other chemotherapy, other medicines, foods, dyes, or preservatives  pregnant or trying to get pregnant  breast-feeding How should I use this medicine? This drug is given as an infusion into a vein. It is administered in a hospital or clinic by a specially trained health care professional. Talk to your pediatrician regarding the use of this medicine in children. Special care may be needed. Overdosage: If you think you have taken too much of this medicine contact a poison control center or emergency room at once. NOTE: This medicine is only for you. Do not share this medicine with others. What if I miss a dose? It is important not to miss your dose. Call your doctor or health care professional if you are unable to keep an appointment. What may interact with this medicine? Do not take this medicine with any of the following medications:  disulfiram  metronidazole This medicine may also interact with the following medications:  antiviral medicines for hepatitis, HIV or AIDS  certain antibiotics like erythromycin and clarithromycin  certain medicines for fungal infections like ketoconazole and itraconazole  certain medicines for seizures like carbamazepine, phenobarbital, phenytoin  gemfibrozil  nefazodone  rifampin  St. John's wort This list may not describe all possible interactions. Give your health care provider a list of all the medicines, herbs, non-prescription drugs, or dietary supplements you use. Also tell them if you smoke, drink alcohol, or use illegal drugs. Some items may interact with your medicine. What should I watch for while using this medicine? Your condition will be monitored carefully while you are receiving this medicine. You will need   important blood work done while you are taking this medicine. This medicine can cause serious allergic reactions. To reduce your risk you will need to take other medicine(s)  before treatment with this medicine. If you experience allergic reactions like skin rash, itching or hives, swelling of the face, lips, or tongue, tell your doctor or health care professional right away. In some cases, you may be given additional medicines to help with side effects. Follow all directions for their use. This drug may make you feel generally unwell. This is not uncommon, as chemotherapy can affect healthy cells as well as cancer cells. Report any side effects. Continue your course of treatment even though you feel ill unless your doctor tells you to stop. Call your doctor or health care professional for advice if you get a fever, chills or sore throat, or other symptoms of a cold or flu. Do not treat yourself. This drug decreases your body's ability to fight infections. Try to avoid being around people who are sick. This medicine may increase your risk to bruise or bleed. Call your doctor or health care professional if you notice any unusual bleeding. Be careful brushing and flossing your teeth or using a toothpick because you may get an infection or bleed more easily. If you have any dental work done, tell your dentist you are receiving this medicine. Avoid taking products that contain aspirin, acetaminophen, ibuprofen, naproxen, or ketoprofen unless instructed by your doctor. These medicines may hide a fever. Do not become pregnant while taking this medicine. Women should inform their doctor if they wish to become pregnant or think they might be pregnant. There is a potential for serious side effects to an unborn child. Talk to your health care professional or pharmacist for more information. Do not breast-feed an infant while taking this medicine. Men are advised not to father a child while receiving this medicine. This product may contain alcohol. Ask your pharmacist or healthcare provider if this medicine contains alcohol. Be sure to tell all healthcare providers you are taking this  medicine. Certain medicines, like metronidazole and disulfiram, can cause an unpleasant reaction when taken with alcohol. The reaction includes flushing, headache, nausea, vomiting, sweating, and increased thirst. The reaction can last from 30 minutes to several hours. What side effects may I notice from receiving this medicine? Side effects that you should report to your doctor or health care professional as soon as possible:  allergic reactions like skin rash, itching or hives, swelling of the face, lips, or tongue  breathing problems  changes in vision  fast, irregular heartbeat  high or low blood pressure  mouth sores  pain, tingling, numbness in the hands or feet  signs of decreased platelets or bleeding - bruising, pinpoint red spots on the skin, black, tarry stools, blood in the urine  signs of decreased red blood cells - unusually weak or tired, feeling faint or lightheaded, falls  signs of infection - fever or chills, cough, sore throat, pain or difficulty passing urine  signs and symptoms of liver injury like dark yellow or brown urine; general ill feeling or flu-like symptoms; light-colored stools; loss of appetite; nausea; right upper belly pain; unusually weak or tired; yellowing of the eyes or skin  swelling of the ankles, feet, hands  unusually slow heartbeat Side effects that usually do not require medical attention (report to your doctor or health care professional if they continue or are bothersome):  diarrhea  hair loss  loss of appetite  muscle or   joint pain  nausea, vomiting  pain, redness, or irritation at site where injected  tiredness This list may not describe all possible side effects. Call your doctor for medical advice about side effects. You may report side effects to FDA at 1-800-FDA-1088. Where should I keep my medicine? This drug is given in a hospital or clinic and will not be stored at home. NOTE: This sheet is a summary. It may not  cover all possible information. If you have questions about this medicine, talk to your doctor, pharmacist, or health care provider.  2020 Elsevier/Gold Standard (2016-09-21 13:14:55)  Carboplatin injection What is this medicine? CARBOPLATIN (KAR boe pla tin) is a chemotherapy drug. It targets fast dividing cells, like cancer cells, and causes these cells to die. This medicine is used to treat ovarian cancer and many other cancers. This medicine may be used for other purposes; ask your health care provider or pharmacist if you have questions. COMMON BRAND NAME(S): Paraplatin What should I tell my health care provider before I take this medicine? They need to know if you have any of these conditions:  blood disorders  hearing problems  kidney disease  recent or ongoing radiation therapy  an unusual or allergic reaction to carboplatin, cisplatin, other chemotherapy, other medicines, foods, dyes, or preservatives  pregnant or trying to get pregnant  breast-feeding How should I use this medicine? This drug is usually given as an infusion into a vein. It is administered in a hospital or clinic by a specially trained health care professional. Talk to your pediatrician regarding the use of this medicine in children. Special care may be needed. Overdosage: If you think you have taken too much of this medicine contact a poison control center or emergency room at once. NOTE: This medicine is only for you. Do not share this medicine with others. What if I miss a dose? It is important not to miss a dose. Call your doctor or health care professional if you are unable to keep an appointment. What may interact with this medicine?  medicines for seizures  medicines to increase blood counts like filgrastim, pegfilgrastim, sargramostim  some antibiotics like amikacin, gentamicin, neomycin, streptomycin, tobramycin  vaccines Talk to your doctor or health care professional before taking any of  these medicines:  acetaminophen  aspirin  ibuprofen  ketoprofen  naproxen This list may not describe all possible interactions. Give your health care provider a list of all the medicines, herbs, non-prescription drugs, or dietary supplements you use. Also tell them if you smoke, drink alcohol, or use illegal drugs. Some items may interact with your medicine. What should I watch for while using this medicine? Your condition will be monitored carefully while you are receiving this medicine. You will need important blood work done while you are taking this medicine. This drug may make you feel generally unwell. This is not uncommon, as chemotherapy can affect healthy cells as well as cancer cells. Report any side effects. Continue your course of treatment even though you feel ill unless your doctor tells you to stop. In some cases, you may be given additional medicines to help with side effects. Follow all directions for their use. Call your doctor or health care professional for advice if you get a fever, chills or sore throat, or other symptoms of a cold or flu. Do not treat yourself. This drug decreases your body's ability to fight infections. Try to avoid being around people who are sick. This medicine may increase your risk to   bruise or bleed. Call your doctor or health care professional if you notice any unusual bleeding. Be careful brushing and flossing your teeth or using a toothpick because you may get an infection or bleed more easily. If you have any dental work done, tell your dentist you are receiving this medicine. Avoid taking products that contain aspirin, acetaminophen, ibuprofen, naproxen, or ketoprofen unless instructed by your doctor. These medicines may hide a fever. Do not become pregnant while taking this medicine. Women should inform their doctor if they wish to become pregnant or think they might be pregnant. There is a potential for serious side effects to an unborn child.  Talk to your health care professional or pharmacist for more information. Do not breast-feed an infant while taking this medicine. What side effects may I notice from receiving this medicine? Side effects that you should report to your doctor or health care professional as soon as possible:  allergic reactions like skin rash, itching or hives, swelling of the face, lips, or tongue  signs of infection - fever or chills, cough, sore throat, pain or difficulty passing urine  signs of decreased platelets or bleeding - bruising, pinpoint red spots on the skin, black, tarry stools, nosebleeds  signs of decreased red blood cells - unusually weak or tired, fainting spells, lightheadedness  breathing problems  changes in hearing  changes in vision  chest pain  high blood pressure  low blood counts - This drug may decrease the number of white blood cells, red blood cells and platelets. You may be at increased risk for infections and bleeding.  nausea and vomiting  pain, swelling, redness or irritation at the injection site  pain, tingling, numbness in the hands or feet  problems with balance, talking, walking  trouble passing urine or change in the amount of urine Side effects that usually do not require medical attention (report to your doctor or health care professional if they continue or are bothersome):  hair loss  loss of appetite  metallic taste in the mouth or changes in taste This list may not describe all possible side effects. Call your doctor for medical advice about side effects. You may report side effects to FDA at 1-800-FDA-1088. Where should I keep my medicine? This drug is given in a hospital or clinic and will not be stored at home. NOTE: This sheet is a summary. It may not cover all possible information. If you have questions about this medicine, talk to your doctor, pharmacist, or health care provider.  2020 Elsevier/Gold Standard (2007-04-25  14:38:05)  Coronavirus (COVID-19) Are you at risk?  Are you at risk for the Coronavirus (COVID-19)?  To be considered HIGH RISK for Coronavirus (COVID-19), you have to meet the following criteria:  . Traveled to [place], [place], [place], [place] or [place]; or in the [place] to [place], [place], [place], or [place]; and have fever, cough, and shortness of breath within the last 2 weeks of travel OR . Been in close contact with a person diagnosed with COVID-19 within the last 2 weeks and have fever, cough, and shortness of breath . IF YOU DO NOT MEET THESE CRITERIA, YOU ARE CONSIDERED LOW RISK FOR COVID-19.  What to do if you are HIGH RISK for COVID-19?  . If you are having a medical emergency, call 911. . Seek medical care right away. Before you go to a doctor's office, urgent care or emergency department, call ahead and tell them about your recent travel, contact   with someone diagnosed with COVID-19, and your symptoms. You should receive instructions from your physician's office regarding next steps of care.  . When you arrive at healthcare provider, tell the healthcare staff immediately you have returned from visiting [place], [place], [place], [place] or [place]; or traveled in the [place] to [place], [place], [place], or [place]; in the last two weeks or you have been in close contact with a person diagnosed with COVID-19 in the last 2 weeks.   . Tell the health care staff about your symptoms: fever, cough and shortness of breath. . After you have been seen by a medical provider, you will be either: o Tested for (COVID-19) and discharged home on quarantine except to seek medical care if symptoms worsen, and asked to  - Stay home and avoid contact with others until you get your results (4-5 days)  - Avoid travel on public transportation if possible (such as bus, train, or airplane) or o Sent to the Emergency Department by EMS for evaluation, COVID-19 testing, and  possible admission depending on your condition and test results.  What to do if you are LOW RISK for COVID-19?  Reduce your risk of any infection by using the same precautions used for avoiding the common cold or flu:  . Wash your hands often with soap and warm water for at least 20 seconds.  If soap and water are not readily available, use an alcohol-based hand sanitizer with at least 60% alcohol.  . If coughing or sneezing, cover your mouth and nose by coughing or sneezing into the elbow areas of your shirt or coat, into a tissue or into your sleeve (not your hands). . Avoid shaking hands with others and consider head nods or verbal greetings only. . Avoid touching your eyes, nose, or mouth with unwashed hands.  . Avoid close contact with people who are sick. . Avoid places or events with large numbers of people in one location, like concerts or sporting events. . Carefully consider travel plans you have or are making. . If you are planning any travel outside or inside the [place], visit the CDC's Travelers' Health webpage for the latest health notices. . If you have some symptoms but not all symptoms, continue to monitor at home and seek medical attention if your symptoms worsen. . If you are having a medical emergency, call 911.   ADDITIONAL HEALTHCARE OPTIONS FOR PATIENTS  [facility] / e-Visit: [email or web address]         [facility]: [phone]  [facility]: [phone]                   [facility]: [phone]   

## 2018-10-04 NOTE — Progress Notes (Signed)
[place] OFFICE PROGRESS NOTE  Patient Care Team: [name], DO as PCP - General (Family Medicine)  ASSESSMENT & PLAN:  Uterine cancer [facility]) She has just completed radiation treatment She is getting progressively pancytopenic likely secondary to recent side effects of additional bone marrow suppression from radiation and chemotherapy We will proceed with treatment but I would recommend delaying her next dose by 1 week to allow recovery of her bone marrow and she agreed  Pancytopenia, acquired ([place]) This is multifactorial, likely secondary to bone marrow suppressive effect from recent chemo and radiation She is not symptomatic and does not need transfusion support We will proceed with treatment without delay but I plan to delay the next dose of treatment  Peripheral neuropathy due to chemotherapy [facility]) she has mild peripheral neuropathy, likely related to side effects of treatment. It is only mild, not bothering the patient. I will observe for now If it gets worse in the future, I will consider modifying the dose of the treatment   Hypokalemia She is not symptomatic I recommend potassium rich diet only   Orders Placed This Encounter  Procedures  . Sample to Blood Bank    Standing Status:   Standing    Number of Occurrences:   33    Standing Expiration Date:   10/04/2019    INTERVAL HISTORY: Please see below for problem oriented charting. She returns for cycle #3 of treatment She denies worsening side effects from treatment such as bone pain or neuropathy No recent infection, fever or chills The patient denies any recent signs or symptoms of bleeding such as spontaneous epistaxis, hematuria or hematochezia. Denies nausea  SUMMARY OF ONCOLOGIC HISTORY: Oncology History Overview Note  Her2 positive   Uterine cancer ([place])  06/06/2018 Initial Diagnosis   She presented with postmenopausal bleeding. Initial biopsy was negative for malignancy    06/06/2018 Pathology Results   She saw Dr Adah Perl in [place] on 06/06/18 and a pap was performed which showed ASCUS.   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 The left tube and ovary was adherent to the sigmoid colon and was not removed.    08/11/2018 Initial Diagnosis   Uterine cancer ([place])   08/15/2018 Imaging   CT scan of chest, abdomen and pelvis 1. Single indeterminate 8 mm aorto-caval retroperitoneal lymph node. No other signs metastatic disease within the chest, abdomen, or pelvis. 2. Moderate to large hiatal hernia. 3. Colonic diverticulosis. No radiographic evidence of diverticulitis.   08/15/2018 Cancer Staging   Staging form: Corpus Uteri - Carcinoma and Carcinosarcoma, AJCC 8th Edition - Pathologic: Stage Unknown (pT1b, pNX, cM0) - Signed by Heath Lark, MD on 08/15/2018   08/22/2018 Procedure   Status post right IJ port catheter placement   08/23/2018 -  Chemotherapy   The patient had carboplatin and taxol for chemotherapy treatment.       REVIEW OF SYSTEMS:   Constitutional: Denies fevers, chills or abnormal weight loss Eyes: Denies blurriness of vision Ears, nose, mouth, throat, and face: Denies mucositis or sore throat Respiratory:  Denies cough, dyspnea or wheezes Cardiovascular: Denies palpitation, chest discomfort or lower extremity swelling Gastrointestinal:  Denies nausea, heartburn or change in bowel habits Skin: Denies abnormal skin rashes Lymphatics: Denies new  lymphadenopathy or easy bruising Neurological:Denies numbness, tingling or new weaknesses Behavioral/Psych: Mood is stable, no new changes  All other systems were reviewed with the patient and are negative.  I have reviewed the past medical history, past surgical history, social history and family history with the patient and they are unchanged from previous note.  ALLERGIES:  is allergic to cephalexin; losartan; shellfish-derived products; sulfa antibiotics; serotonin; alendronate; beef-derived products; galactose; and lisinopril.  MEDICATIONS:  Current Outpatient Medications  Medication Sig Dispense Refill  . acetaminophen (TYLENOL) 500 MG tablet Take 500 mg by mouth every 6 (six) hours as needed.    . clonazePAM (KLONOPIN) 1 MG tablet 1 mg 2 (two) times daily.     Marland Kitchen dexamethasone (DECADRON) 4 MG tablet Take 2  tabs at the night before and 2 tab the morning of chemotherapy, every 3 weeks, by mouth 24 tablet 11  . EPINEPHrine 0.3 mg/0.3 mL IJ SOAJ injection epinephrine 0.3 mg/0.3 mL injection, auto-injector  USE AS DIRECTED    . lidocaine-prilocaine (EMLA) cream Apply to affected area once (Patient not taking: Reported on 08/16/2018) 30 g 3  . naproxen sodium (ALEVE) 220 MG tablet Take 220 mg by mouth.    . Nebivolol HCl (BYSTOLIC) 20 MG TABS Bystolic 20 mg tablet  TAKE ONE TABLET BY MOUTH DAILY    . nitroGLYCERIN (NITROSTAT) 0.4 MG SL tablet nitroglycerin 0.4 mg sublingual tablet    . omeprazole (PRILOSEC) 40 MG capsule Take 40 mg by mouth daily.     . ondansetron (ZOFRAN) 8 MG tablet Take 1 tablet (8 mg total) by mouth every 8 (eight) hours as needed for refractory nausea / vomiting. (Patient not taking: Reported on 08/16/2018) 30 tablet 1  . prochlorperazine (COMPAZINE) 10 MG tablet Take 1 tablet (10 mg total) by mouth every 6 (six) hours as needed (Nausea or vomiting). (Patient not taking: Reported on 08/16/2018) 30 tablet 1  . traMADol (ULTRAM) 50 MG tablet Take 2 tablets (100 mg total)  by mouth every 6 (six) hours as needed. 60 tablet 0   No current facility-administered medications for this visit.    Facility-Administered Medications Ordered in Other Visits  Medication Dose Route Frequency Provider Last Rate Last Dose  . CARBOplatin (PARAPLATIN) 490 mg in sodium chloride 0.9 % 250 mL chemo infusion  490 mg Intravenous Once Alvy Bimler, Vastie Douty, MD      . famotidine (PEPCID) IVPB 20 mg premix  20 mg Intravenous Once Alvy Bimler, Jaziah Kwasnik, MD 200 mL/hr at 10/04/18 0951 20 mg at 10/04/18 0951  . fosaprepitant (EMEND) 150 mg, dexamethasone (DECADRON) 12 mg in sodium chloride 0.9 % 145 mL IVPB   Intravenous Once Alvy Bimler, Margareta Laureano, MD      . heparin lock flush 100 unit/mL  500 Units Intracatheter Once PRN Alvy Bimler, Acxel Dingee, MD      . PACLitaxel (TAXOL) 234 mg in sodium chloride 0.9 % 250 mL chemo infusion (> 25m/m2)  140 mg/m2 (Treatment Plan Recorded) Intravenous Once Gwendolyn Nishi, MD      . sodium chloride flush (NS) 0.9 % injection 10 mL  10 mL Intracatheter PRN GAlvy Bimler Franciso Dierks, MD        PHYSICAL EXAMINATION: ECOG PERFORMANCE STATUS: 1 - Symptomatic but completely ambulatory  Vitals:   10/04/18 0842  BP: (!) 158/62  Pulse: 80  Resp: 18  Temp: 98.9 F (37.2 C)  SpO2: 99%   Filed Weights   10/04/18 0842  Weight: 152 lb 6.4 oz (69.1 kg)    GENERAL:alert, no distress and comfortable SKIN: skin color, texture, turgor are normal, no rashes or significant lesions EYES: normal, Conjunctiva are pink and non-injected, sclera clear OROPHARYNX:no exudate, no erythema and lips, buccal mucosa, and tongue normal  NECK: supple, thyroid normal size, non-tender, without nodularity LYMPH:  no palpable lymphadenopathy in the cervical, axillary or inguinal LUNGS: clear to auscultation and percussion with normal breathing effort HEART: regular rate & rhythm and no murmurs and no lower extremity edema ABDOMEN:abdomen soft, non-tender and normal bowel sounds Musculoskeletal:no cyanosis of digits and no clubbing   NEURO: alert & oriented x 3 with fluent speech, no focal motor/sensory deficits  LABORATORY DATA:  I have reviewed the data as listed    Component Value Date/Time   NA 142 10/04/2018 0820   K 3.0 (LL) 10/04/2018 0820   CL 107 10/04/2018 0820   CO2 20 (L) 10/04/2018 0820   GLUCOSE 231 (H) 10/04/2018 0820   BUN 14 10/04/2018 0820   CREATININE 0.72 10/04/2018 0820   CALCIUM 8.3 (L) 10/04/2018 0820   PROT 7.1 10/04/2018 0820   ALBUMIN 3.7 10/04/2018 0820   AST 23 10/04/2018 0820   ALT 29 10/04/2018 0820   ALKPHOS 83 10/04/2018 0820   BILITOT 0.2 (L) 10/04/2018 0820   GFRNONAA >60 10/04/2018 0820   GFRAA >60 10/04/2018 0820    No results found for: SPEP, UPEP  Lab Results  Component Value Date   WBC 2.4 (L) 10/04/2018   NEUTROABS 1.8 10/04/2018   HGB 9.8 (L) 10/04/2018   HCT 30.6 (L) 10/04/2018   MCV 92.7 10/04/2018   PLT 122 (L) 10/04/2018      Chemistry      Component Value Date/Time   NA 142 10/04/2018 0820   K 3.0 (LL) 10/04/2018 0820   CL 107 10/04/2018 0820   CO2 20 (L) 10/04/2018 0820   BUN 14 10/04/2018 0820   CREATININE 0.72 10/04/2018 0820      Component Value Date/Time   CALCIUM 8.3 (L) 10/04/2018 0820   ALKPHOS 83 10/04/2018 0820   AST 23 10/04/2018 0820   ALT 29 10/04/2018 0820   BILITOT 0.2 (L) 10/04/2018 0820       All questions were answered. The patient knows to call the clinic with any problems, questions or concerns. No barriers to learning was detected.  I spent 25 minutes counseling the patient face to face. The total time spent in the appointment was 30 minutes and more than 50% was on counseling and review of test results  Heath Lark, MD 10/04/2018 10:01 AM

## 2018-10-04 NOTE — Assessment & Plan Note (Signed)
She is not symptomatic I recommend potassium rich diet only

## 2018-10-04 NOTE — Assessment & Plan Note (Signed)
She has just completed radiation treatment She is getting progressively pancytopenic likely secondary to recent side effects of additional bone marrow suppression from radiation and chemotherapy We will proceed with treatment but I would recommend delaying her next dose by 1 week to allow recovery of her bone marrow and she agreed

## 2018-10-04 NOTE — Progress Notes (Signed)
Per Dr. Alvy Bimler, patient will not receive supplementation for potassium today.  Patient is to be encouraged to eat potassium rich foods. Patient verbalized understanding.

## 2018-10-04 NOTE — Assessment & Plan Note (Signed)
This is multifactorial, likely secondary to bone marrow suppressive effect from recent chemo and radiation She is not symptomatic and does not need transfusion support We will proceed with treatment without delay but I plan to delay the next dose of treatment

## 2018-10-05 ENCOUNTER — Telehealth: Payer: Self-pay | Admitting: Oncology

## 2018-10-05 ENCOUNTER — Other Ambulatory Visit: Payer: Self-pay | Admitting: Hematology and Oncology

## 2018-10-05 MED ORDER — TRAMADOL HCL 50 MG PO TABS: 100.0000 mg | ORAL_TABLET | Freq: Four times a day (QID) | ORAL | 0 refills | Status: DC | PRN

## 2018-10-05 NOTE — Telephone Encounter (Signed)
Christina Hickman and asked which pharmacy she would like the tramadol refill sent to.  She said to please send it to [facility] in [place], [place].

## 2018-10-05 NOTE — Telephone Encounter (Signed)
done

## 2018-10-30 ENCOUNTER — Other Ambulatory Visit: Payer: Self-pay

## 2018-10-30 ENCOUNTER — Ambulatory Visit
Admission: RE | Admit: 2018-10-30 | Discharge: 2018-10-30 | Disposition: A | Discharge status: Home / Self Care | Payer: Medicare Other | Source: Ambulatory Visit | Attending: Radiation Oncology | Admitting: Radiation Oncology

## 2018-10-30 ENCOUNTER — Ambulatory Visit: Payer: Medicare Other | Admitting: Radiation Oncology

## 2018-10-30 ENCOUNTER — Encounter: Payer: Self-pay | Admitting: Radiation Oncology

## 2018-10-30 VITALS — BP 156/80 | HR 67 | Temp 98.3°F | Resp 18 | Ht 59.0 in | Wt 146.4 lb

## 2018-10-30 DIAGNOSIS — Z79899 Other long term (current) drug therapy: Secondary | ICD-10-CM | POA: Insufficient documentation

## 2018-10-30 DIAGNOSIS — Z923 Personal history of irradiation: Secondary | ICD-10-CM | POA: Diagnosis not present

## 2018-10-30 DIAGNOSIS — K5900 Constipation, unspecified: Secondary | ICD-10-CM | POA: Diagnosis not present

## 2018-10-30 DIAGNOSIS — R5383 Other fatigue: Secondary | ICD-10-CM | POA: Insufficient documentation

## 2018-10-30 DIAGNOSIS — C541 Malignant neoplasm of endometrium: Secondary | ICD-10-CM | POA: Insufficient documentation

## 2018-10-30 DIAGNOSIS — Z9221 Personal history of antineoplastic chemotherapy: Secondary | ICD-10-CM | POA: Insufficient documentation

## 2018-10-30 DIAGNOSIS — M549 Dorsalgia, unspecified: Secondary | ICD-10-CM | POA: Insufficient documentation

## 2018-10-30 NOTE — Patient Instructions (Addendum)
Coronavirus (COVID-19) Are you at risk?  Are you at risk for the Coronavirus (COVID-19)?  To be considered HIGH RISK for Coronavirus (COVID-19), you have to meet the following criteria:  . Traveled to [place], [place], [place], [place] or [place]; or in the [place] to [place], [place], [place], or [place]; and have fever, cough, and shortness of breath within the last 2 weeks of travel OR . Been in close contact with a person diagnosed with COVID-19 within the last 2 weeks and have fever, cough, and shortness of breath . IF YOU DO NOT MEET THESE CRITERIA, YOU ARE CONSIDERED LOW RISK FOR COVID-19.  What to do if you are HIGH RISK for COVID-19?  . If you are having a medical emergency, call 911. . Seek medical care right away. Before you go to a doctor's office, urgent care or emergency department, call ahead and tell them about your recent travel, contact with someone diagnosed with COVID-19, and your symptoms. You should receive instructions from your physician's office regarding next steps of care.  . When you arrive at healthcare provider, tell the healthcare staff immediately you have returned from visiting [place], [place], [place], [place] or [place]; or traveled in the [place] to [place], [place], [place], or [place]; in the last two weeks or you have been in close contact with a person diagnosed with COVID-19 in the last 2 weeks.   . Tell the health care staff about your symptoms: fever, cough and shortness of breath. . After you have been seen by a medical provider, you will be either: o Tested for (COVID-19) and discharged home on quarantine except to seek medical care if symptoms worsen, and asked to  - Stay home and avoid contact with others until you get your results (4-5 days)  - Avoid travel on public transportation if possible (such as bus, train, or airplane) or o Sent to the Emergency Department by EMS for evaluation, COVID-19 testing, and possible  admission depending on your condition and test results.  What to do if you are LOW RISK for COVID-19?  Reduce your risk of any infection by using the same precautions used for avoiding the common cold or flu:  . Wash your hands often with soap and warm water for at least 20 seconds.  If soap and water are not readily available, use an alcohol-based hand sanitizer with at least 60% alcohol.  . If coughing or sneezing, cover your mouth and nose by coughing or sneezing into the elbow areas of your shirt or coat, into a tissue or into your sleeve (not your hands). . Avoid shaking hands with others and consider head nods or verbal greetings only. . Avoid touching your eyes, nose, or mouth with unwashed hands.  . Avoid close contact with people who are sick. . Avoid places or events with large numbers of people in one location, like concerts or sporting events. . Carefully consider travel plans you have or are making. . If you are planning any travel outside or inside the [place], visit the CDC's Travelers' Health webpage for the latest health notices. . If you have some symptoms but not all symptoms, continue to monitor at home and seek medical attention if your symptoms worsen. . If you are having a medical emergency, call 911.   ADDITIONAL HEALTHCARE OPTIONS FOR PATIENTS  [facility] / e-Visit: [email or web address]         [facility]: [phone]  [facility]: [phone]                   [facility]: [phone]             Home Care Instructions for the Insertion and Care of Your Vaginal Dilator  Why Do I Need a Vaginal Dilator?  Internal radiation therapy may cause scar tissue to form at the top of your vagina (vaginal cuff).  This may make vaginal examinations difficult in the future. You can prevent scar tissue from forming by using a vaginal dilator (a smooth plastic rod), and/or  by having regular sexual intercourse.  If not using the dilator you should be having intercourse two or three times a week.  If you are unable to have intercourse, you should use your vaginal dilator.  You may have some spotting or bleeding from your dilator or intercourse the first few times. You may also have some discomfort. If discomfort occurs with intercourse, you and your partner may need to stop for a while and try again later.  How to Use Your Vaginal Dilator  - Wash the dilator with soap and water before and after each use. - Check the dilator to be sure it is smooth. Do not use the dilator if you find any roughspots. - Coat the dilator with K-Y Jelly, Astroglide, or Replens. Do not use Vaseline, baby oil, or other oil based lubricants. They are not water-soluble and can be irritating to the tissues in the vagina. - Lie on your back with your knees bent and legs apart. - Insert the rounded end of the dilator into your vagina as far as it will go without causing pain or discomfort. - Close your knees and slowly straighten your legs. - Keep the dilator in your vagina for about 10 to 15 minutes.  Please use 3 times a week, for example: Monday, Wednesday and Friday evenings. - Bend your knees, open your legs, and gently remove the dilator. - Gently cleanse the skin around the vaginal opening. - Wash the dilator after each use. -  It is important that you use the dilator routinely until instructed otherwise by your doctor.    

## 2018-10-30 NOTE — Progress Notes (Signed)
Radiation Oncology         [phone] ________________________________  Name: Christina Hickman MRN: [id number]  Date: 10/30/2018  DOB: April 03, 1950  Follow-Up Visit Note  CC: [name], DO  Elvina Mattes Claudette Laws, DO    ICD-10-CM   1. 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 She continues on chemotherapy consisting of carboplatin and taxol under Dr. Alvy Bimler, whom she last saw on 10/04/2018.    On review of systems, she reports spasm-like back pain, rated 8/10 and worsened by the weather. She also reports fatigue and constipation related to her chemotherapy, as well as nausea following her last cycle. She denies dysuria or hematuria, vaginal discharge or bleeding, rectal bleeding, and abdominal bloating.  She reports no side effects from her vaginal brachii therapy  ALLERGIES:  is allergic to cephalexin; losartan; shellfish-derived products; sulfa antibiotics; serotonin; alendronate; beef-derived products; galactose; and lisinopril.  Meds: Current Outpatient Medications  Medication Sig Dispense Refill  . acetaminophen (TYLENOL) 500 MG tablet Take 500 mg by mouth every 6 (six) hours as needed.    . clonazePAM (KLONOPIN) 1 MG tablet 1 mg 2 (two) times daily.     Marland Kitchen dexamethasone (DECADRON) 4 MG tablet Take 2  tabs at the night before and 2 tab the morning of chemotherapy, every 3 weeks, by mouth 24 tablet 11  . EPINEPHrine 0.3 mg/0.3 mL IJ SOAJ injection epinephrine 0.3 mg/0.3 mL injection, auto-injector  USE AS DIRECTED    . lidocaine-prilocaine (EMLA) cream Apply to affected area once 30 g 3  . Nebivolol HCl (BYSTOLIC) 20 MG TABS Bystolic 20 mg  tablet  TAKE ONE TABLET BY MOUTH DAILY    . nitroGLYCERIN (NITROSTAT) 0.4 MG SL tablet nitroglycerin 0.4 mg sublingual tablet    . omeprazole (PRILOSEC) 40 MG capsule Take 40 mg by mouth daily.     . ondansetron (ZOFRAN) 8 MG tablet Take 1 tablet (8 mg total) by mouth every 8 (eight) hours as needed for refractory nausea / vomiting. 30 tablet 1  . prochlorperazine (COMPAZINE) 10 MG tablet Take 1 tablet (10 mg total) by mouth every 6 (six) hours as needed (Nausea or vomiting). 30 tablet 1  . traMADol (ULTRAM) 50 MG tablet Take 2 tablets (100 mg total) by mouth every 6 (six) hours as needed. 60 tablet 0  . naproxen sodium (ALEVE) 220 MG tablet Take 220 mg by mouth.     No current facility-administered medications for this encounter.     Physical Findings: The patient is in no acute distress. Patient is alert and oriented.  height is 4\' 11"  (1.499 m) and weight is 146 lb 6 oz (66.4 kg). Her temporal temperature is 98.3 F (36.8 C). Her blood pressure is 156/80 (abnormal) and her pulse is 67. Her respiration is 18 and oxygen saturation is 99%. .  No significant changes. Lungs are clear to auscultation bilaterally. Heart has regular rate and rhythm. No palpable cervical, supraclavicular, or axillary adenopathy. Abdomen soft, non-tender, normal bowel sounds. Pelvic exam not performed in light of recent completion of treatment  Lab Findings: Lab Results  Component Value Date   WBC 2.4 (L) 10/04/2018   HGB  9.8 (L) 10/04/2018   HCT 30.6 (L) 10/04/2018   MCV 92.7 10/04/2018   PLT 122 (L) 10/04/2018    Radiographic Findings: No results found.  Impression: Patient tolerated her vaginal brachii therapy quite well without any obvious side effects.  Plan: Patient will continue with adjuvant chemotherapy.  Today the patient was given a vaginal dilator and instructions on its use.  Patient will follow up with Dr. Denman George in approximately 2 weeks 2 months in radiation oncology in 5 months.   ____________________________________ Gery Pray, MD   This document serves as a record of services personally performed by Gery Pray, MD. It was created on his behalf by Wilburn Mylar, a trained medical scribe. The creation of this record is based on the scribe's personal observations and the provider's statements to them. This document has been checked and approved by the attending provider.

## 2018-10-30 NOTE — Progress Notes (Incomplete)
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 She was not noted to have any acute side effects.  Plan: The patient will follow-up with radiation oncology in 1 month .  ________________________________________________  Blair Promise, PhD, MD  This document serves as a record of services personally performed by Gery Pray, MD. It was created on his behalf by Wilburn Mylar, a trained medical scribe. The creation of this record is based on the scribe's personal observations and the provider's statements to them. This document has been checked and approved by the attending provider.

## 2018-10-30 NOTE — Progress Notes (Addendum)
Pt presents today for f/u with Dr. Sondra Come. Pt reports back spasm-like pain, rated 8/10, attributed to weather. Pt reports fatigue varies according to chemotherapy. Pt receives chemotherapy every three weeks. Pt denies dysuria/hematuria. Pt denies vaginal bleeding/discharge. Pt denies rectal bleeding. Pt reports ongoing constipation attributed to chemotherapy. Pt denies abdominal bloating. Pt reports last cycle of chemotherapy caused severe nausea.   BP (!) 156/80 (BP Location: Left Arm, Patient Position: Sitting)   Pulse 67   Temp 98.3 F (36.8 C) (Temporal)   Resp 18   Ht 4\' 11"  (1.499 m)   Wt 146 lb 6 oz (66.4 kg)   SpO2 99%   BMI 29.56 kg/m   Wt Readings from Last 3 Encounters:  10/30/18 146 lb 6 oz (66.4 kg)  10/04/18 152 lb 6.4 oz (69.1 kg)  09/13/18 152 lb 12.8 oz (69.3 kg)   Dilator teaching given with fair understanding. Loma Sousa, RN BSN    Home Care Instructions for the Insertion and Care of Your Vaginal Dilator  Why Do I Need a Vaginal Dilator?  Internal radiation therapy may cause scar tissue to form at the top of your vagina (vaginal cuff).  This may make vaginal examinations difficult in the future. You can prevent scar tissue from forming by using a vaginal dilator (a smooth plastic rod), and/or by having regular sexual intercourse.  If not using the dilator you should be having intercourse two or three times a week.  If you are unable to have intercourse, you should use your vaginal dilator.  You may have some spotting or bleeding from your dilator or intercourse the first few times. You may also have some discomfort. If discomfort occurs with intercourse, you and your partner may need to stop for a while and try again later.  How to Use Your Vaginal Dilator  - Wash the dilator with soap and water before and after each use. - Check the dilator to be sure it is smooth. Do not use the dilator if you find any roughspots. - Coat the dilator with K-Y Jelly,  Astroglide, or Replens. Do not use Vaseline, baby oil, or other oil based lubricants. They are not water-soluble and can be irritating to the tissues in the vagina. - Lie on your back with your knees bent and legs apart. - Insert the rounded end of the dilator into your vagina as far as it will go without causing pain or discomfort. - Close your knees and slowly straighten your legs. - Keep the dilator in your vagina for about 10 to 15 minutes.  Please use 3 times a week, for example: Monday, Wednesday and Friday evenings. [facility] your knees, open your legs, and gently remove the dilator. - Gently cleanse the skin around the vaginal opening. - Wash the dilator after each use. -  It is important that you use the dilator routinely until instructed otherwise by your doctor.   Loma Sousa, RN BSN

## 2018-10-31 ENCOUNTER — Inpatient Hospital Stay: Payer: Medicare Other

## 2018-10-31 ENCOUNTER — Inpatient Hospital Stay (HOSPITAL_BASED_OUTPATIENT_CLINIC_OR_DEPARTMENT_OTHER): Payer: Medicare Other | Admitting: Hematology and Oncology

## 2018-10-31 ENCOUNTER — Telehealth: Payer: Self-pay | Admitting: *Deleted

## 2018-10-31 ENCOUNTER — Other Ambulatory Visit: Payer: Self-pay

## 2018-10-31 ENCOUNTER — Encounter: Payer: Self-pay | Admitting: Hematology and Oncology

## 2018-10-31 ENCOUNTER — Encounter: Payer: Self-pay | Admitting: Oncology

## 2018-10-31 DIAGNOSIS — C549 Malignant neoplasm of corpus uteri, unspecified: Secondary | ICD-10-CM

## 2018-10-31 DIAGNOSIS — D61818 Other pancytopenia: Secondary | ICD-10-CM | POA: Diagnosis not present

## 2018-10-31 DIAGNOSIS — R110 Nausea: Secondary | ICD-10-CM

## 2018-10-31 DIAGNOSIS — C541 Malignant neoplasm of endometrium: Secondary | ICD-10-CM

## 2018-10-31 DIAGNOSIS — R42 Dizziness and giddiness: Secondary | ICD-10-CM | POA: Diagnosis not present

## 2018-10-31 DIAGNOSIS — Z5111 Encounter for antineoplastic chemotherapy: Secondary | ICD-10-CM | POA: Diagnosis not present

## 2018-10-31 LAB — CMP (CANCER CENTER ONLY)
ALT: 31 U/L (ref 0–44)
AST: 22 U/L (ref 15–41)
Albumin: 4.2 g/dL (ref 3.5–5.0)
Alkaline Phosphatase: 83 U/L (ref 38–126)
Anion gap: 12 (ref 5–15)
BUN: 15 mg/dL (ref 8–23)
CO2: 24 mmol/L (ref 22–32)
Calcium: 10 mg/dL (ref 8.9–10.3)
Chloride: 105 mmol/L (ref 98–111)
Creatinine: 0.84 mg/dL (ref 0.44–1.00)
GFR, Est AFR Am: >60 mL/min (ref >60)
GFR, Estimated: >60 mL/min (ref >60)
Glucose, Bld: 141 mg/dL — ABNORMAL HIGH (ref 70–99)
Potassium: 4.1 mmol/L (ref 3.5–5.1)
Sodium: 141 mmol/L (ref 135–145)
Total Bilirubin: 0.3 mg/dL (ref 0.3–1.2)
Total Protein: 7.9 g/dL (ref 6.5–8.1)

## 2018-10-31 LAB — CBC WITH DIFFERENTIAL (CANCER CENTER ONLY)
Abs Immature Granulocytes: 0.03 10*3/uL (ref 0.00–0.07)
Basophils Absolute: 0 10*3/uL (ref 0.0–0.1)
Basophils Relative: 0 %
Eosinophils Absolute: 0 10*3/uL (ref 0.0–0.5)
Eosinophils Relative: 0 %
HCT: 31.5 % — ABNORMAL LOW (ref 36.0–46.0)
Hemoglobin: 10.2 g/dL — ABNORMAL LOW (ref 12.0–15.0)
Immature Granulocytes: 1 %
Lymphocytes Relative: 13 %
Lymphs Abs: 0.8 10*3/uL (ref 0.7–4.0)
MCH: 32 pg (ref 26.0–34.0)
MCHC: 32.4 g/dL (ref 30.0–36.0)
MCV: 98.7 fL (ref 80.0–100.0)
Monocytes Absolute: 0.4 10*3/uL (ref 0.1–1.0)
Monocytes Relative: 7 %
Neutro Abs: 4.8 10*3/uL (ref 1.7–7.7)
Neutrophils Relative %: 79 %
Platelet Count: 255 10*3/uL (ref 150–400)
RBC: 3.19 MIL/uL — ABNORMAL LOW (ref 3.87–5.11)
RDW: 20.7 % — ABNORMAL HIGH (ref 11.5–15.5)
WBC Count: 6.1 10*3/uL (ref 4.0–10.5)
nRBC: 0 % (ref 0.0–0.2)

## 2018-10-31 LAB — SAMPLE TO BLOOD BANK

## 2018-10-31 MED ORDER — PALONOSETRON HCL INJECTION 0.25 MG/5ML
INTRAVENOUS | Status: AC
  Filled 2018-10-31: qty 5

## 2018-10-31 MED ORDER — SODIUM CHLORIDE 0.9 % IV SOLN
480.0000 mg | Freq: Once | INTRAVENOUS | Status: AC
  Administered 2018-10-31: 16:00:00 480 mg via INTRAVENOUS
  Filled 2018-10-31: qty 48

## 2018-10-31 MED ORDER — FAMOTIDINE IN NACL 20-0.9 MG/50ML-% IV SOLN
20.0000 mg | Freq: Once | INTRAVENOUS | Status: AC
  Administered 2018-10-31: 20 mg via INTRAVENOUS

## 2018-10-31 MED ORDER — ONDANSETRON HCL 8 MG PO TABS: 8.0000 mg | ORAL_TABLET | Freq: Three times a day (TID) | ORAL | 1 refills | Status: AC | PRN

## 2018-10-31 MED ORDER — DIPHENHYDRAMINE HCL 50 MG/ML IJ SOLN
INTRAMUSCULAR | Status: AC
  Filled 2018-10-31: qty 1

## 2018-10-31 MED ORDER — PALONOSETRON HCL INJECTION 0.25 MG/5ML
0.2500 mg | Freq: Once | INTRAVENOUS | Status: AC
  Administered 2018-10-31: 0.25 mg via INTRAVENOUS

## 2018-10-31 MED ORDER — SODIUM CHLORIDE 0.9% FLUSH
10.0000 mL | Freq: Once | INTRAVENOUS | Status: AC
  Administered 2018-10-31: 10 mL
  Filled 2018-10-31: qty 10

## 2018-10-31 MED ORDER — TRAMADOL HCL 50 MG PO TABS: 100.0000 mg | ORAL_TABLET | Freq: Four times a day (QID) | ORAL | 0 refills | Status: AC | PRN

## 2018-10-31 MED ORDER — HEPARIN SOD (PORK) LOCK FLUSH 100 UNIT/ML IV SOLN
500.0000 [IU] | Freq: Once | INTRAVENOUS | Status: AC | PRN
  Administered 2018-10-31: 500 [IU]
  Filled 2018-10-31: qty 5

## 2018-10-31 MED ORDER — SODIUM CHLORIDE 0.9 % IV SOLN
Freq: Once | INTRAVENOUS | Status: AC
  Administered 2018-10-31: 12:00:00 via INTRAVENOUS
  Filled 2018-10-31: qty 5

## 2018-10-31 MED ORDER — SODIUM CHLORIDE 0.9 % IV SOLN
Freq: Once | INTRAVENOUS | Status: AC
  Administered 2018-10-31: 11:00:00 via INTRAVENOUS
  Filled 2018-10-31: qty 250

## 2018-10-31 MED ORDER — PROCHLORPERAZINE MALEATE 10 MG PO TABS: 10.0000 mg | ORAL_TABLET | Freq: Four times a day (QID) | ORAL | 1 refills | Status: AC | PRN

## 2018-10-31 MED ORDER — FAMOTIDINE IN NACL 20-0.9 MG/50ML-% IV SOLN
INTRAVENOUS | Status: AC
  Filled 2018-10-31: qty 50

## 2018-10-31 MED ORDER — SODIUM CHLORIDE 0.9% FLUSH
10.0000 mL | INTRAVENOUS | Status: DC | PRN
  Administered 2018-10-31: 10 mL
  Filled 2018-10-31: qty 10

## 2018-10-31 MED ORDER — INFLUENZA VAC A&B SA ADJ QUAD 0.5 ML IM PRSY
PREFILLED_SYRINGE | INTRAMUSCULAR | Status: AC
  Filled 2018-10-31: qty 0.5

## 2018-10-31 MED ORDER — SODIUM CHLORIDE 0.9 % IV SOLN
140.0000 mg/m2 | Freq: Once | INTRAVENOUS | Status: AC
  Administered 2018-10-31: 234 mg via INTRAVENOUS
  Filled 2018-10-31: qty 39

## 2018-10-31 MED ORDER — DIPHENHYDRAMINE HCL 50 MG/ML IJ SOLN
50.0000 mg | Freq: Once | INTRAMUSCULAR | Status: AC
  Administered 2018-10-31: 12:00:00 50 mg via INTRAVENOUS

## 2018-10-31 NOTE — Assessment & Plan Note (Signed)
She has some mild dizziness I wonder if it could be related to hypotension I recommend she consult with her primary care doctor to see if her medication needs to be adjusted I recommend increase hydration as tolerated

## 2018-10-31 NOTE — Patient Instructions (Signed)
[facility] Discharge Instructions for Patients Receiving Chemotherapy  Today you received the following chemotherapy agents Paclitaxel (TAXOL) & Carboplatin (PARAPLATIN).  To help prevent nausea and vomiting after your treatment, we encourage you to take your nausea medication as prescribed.   If you develop nausea and vomiting that is not controlled by your nausea medication, call the clinic.   BELOW ARE SYMPTOMS THAT SHOULD BE REPORTED IMMEDIATELY:  *FEVER GREATER THAN 100.5 F  *CHILLS WITH OR WITHOUT FEVER  NAUSEA AND VOMITING THAT IS NOT CONTROLLED WITH YOUR NAUSEA MEDICATION  *UNUSUAL SHORTNESS OF BREATH  *UNUSUAL BRUISING OR BLEEDING  TENDERNESS IN MOUTH AND THROAT WITH OR WITHOUT PRESENCE OF ULCERS  *URINARY PROBLEMS  *BOWEL PROBLEMS  UNUSUAL RASH Items with * indicate a potential emergency and should be followed up as soon as possible.  Feel free to call the clinic should you have any questions or concerns. The clinic phone number is [phone].  Please show the CHEMO ALERT CARD at check-in to the Emergency Department and triage nurse.  Coronavirus (COVID-19) Are you at risk?  Are you at risk for the Coronavirus (COVID-19)?  To be considered HIGH RISK for Coronavirus (COVID-19), you have to meet the following criteria:  . Traveled to [place], [place], [place], [place] or [place]; or in the [place] to [place], [place], [place], or [place]; and have fever, cough, and shortness of breath within the last 2 weeks of travel OR . Been in close contact with a person diagnosed with COVID-19 within the last 2 weeks and have fever, cough, and shortness of breath . IF YOU DO NOT MEET THESE CRITERIA, YOU ARE CONSIDERED LOW RISK FOR COVID-19.  What to do if you are HIGH RISK for COVID-19?  . If you are having a medical emergency, call 911. . Seek medical care right away. Before you go to a doctor's office, urgent care or emergency department,  call ahead and tell them about your recent travel, contact with someone diagnosed with COVID-19, and your symptoms. You should receive instructions from your physician's office regarding next steps of care.  . When you arrive at healthcare provider, tell the healthcare staff immediately you have returned from visiting [place], [place], [place], [place] or [place]; or traveled in the [place] to [place], [place], [place], or [place]; in the last two weeks or you have been in close contact with a person diagnosed with COVID-19 in the last 2 weeks.   . Tell the health care staff about your symptoms: fever, cough and shortness of breath. . After you have been seen by a medical provider, you will be either: o Tested for (COVID-19) and discharged home on quarantine except to seek medical care if symptoms worsen, and asked to  - Stay home and avoid contact with others until you get your results (4-5 days)  - Avoid travel on public transportation if possible (such as bus, train, or airplane) or o Sent to the Emergency Department by EMS for evaluation, COVID-19 testing, and possible admission depending on your condition and test results.  What to do if you are LOW RISK for COVID-19?  Reduce your risk of any infection by using the same precautions used for avoiding the common cold or flu:  . Wash your hands often with soap and warm water for at least 20 seconds.  If soap and water are not readily available, use an alcohol-based hand sanitizer with at least 60% alcohol.  .   If coughing or sneezing, cover your mouth and nose by coughing or sneezing into the elbow areas of your shirt or coat, into a tissue or into your sleeve (not your hands). . Avoid shaking hands with others and consider head nods or verbal greetings only. . Avoid touching your eyes, nose, or mouth with unwashed hands.  . Avoid close contact with people who are sick. . Avoid places or events with large numbers of people in one  location, like concerts or sporting events. . Carefully consider travel plans you have or are making. . If you are planning any travel outside or inside the [place], visit the CDC's Travelers' Health webpage for the latest health notices. . If you have some symptoms but not all symptoms, continue to monitor at home and seek medical attention if your symptoms worsen. . If you are having a medical emergency, call 911.   ADDITIONAL HEALTHCARE OPTIONS FOR PATIENTS  [facility] / e-Visit: [email or web address]         [facility]: [phone]  [facility]: [phone]                   [facility]: [phone]   

## 2018-10-31 NOTE — Assessment & Plan Note (Signed)
Her pancytopenia has improved with additional week off treatment We will continue treatment as scheduled

## 2018-10-31 NOTE — Assessment & Plan Note (Signed)
She is recovering well from treatment although she has lost some weight I will adjust the dose of treatment a little bit I recommend her to consult with her primary care doctor to see if her blood pressure medication needs to be reduced due to recent dizziness We will resume treatment as scheduled

## 2018-10-31 NOTE — Progress Notes (Signed)
[facility] OFFICE PROGRESS NOTE  Patient Care Team: [name], DO as PCP - General (Family Medicine)  ASSESSMENT & PLAN:  Uterine cancer [facility]) She is recovering well from treatment although she has lost some weight I will adjust the dose of treatment a little bit I recommend her to consult with her primary care doctor to see if her blood pressure medication needs to be reduced due to recent dizziness We will resume treatment as scheduled  Pancytopenia, acquired ([place]) Her pancytopenia has improved with additional week off treatment We will continue treatment as scheduled  Dizziness She has some mild dizziness I wonder if it could be related to hypotension I recommend she consult with her primary care doctor to see if her medication needs to be adjusted I recommend increase hydration as tolerated  Nausea without vomiting She has some mild nausea but no vomiting I recommend antiemetics I refill her prescription    No orders of the defined types were placed in this encounter.   INTERVAL HISTORY: Please see below for problem oriented charting. She is seen prior to cycle 4 of treatment She complained of recent dizziness She also have some nausea but no vomiting and due to reduced oral intake, she has lost some weight Denies recent constipation No recent fever or chills  SUMMARY OF ONCOLOGIC HISTORY: Oncology History Overview Note  Her2 positive   Uterine cancer ([place])  06/06/2018 Initial Diagnosis   She presented with postmenopausal bleeding. Initial biopsy was negative for malignancy   06/06/2018 Pathology Results   She saw Dr Adah Perl in [place] on 06/06/18 and a pap was performed which showed ASCUS.   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 The left tube and ovary was adherent to the sigmoid colon and was not removed.    08/11/2018 Initial Diagnosis   Uterine cancer ([place])   08/15/2018 Imaging   CT scan of chest, abdomen and pelvis 1. Single indeterminate 8 mm aorto-caval retroperitoneal lymph node. No other signs metastatic disease within the chest, abdomen, or pelvis. 2. Moderate to large hiatal hernia. 3. Colonic diverticulosis. No radiographic evidence of diverticulitis.   08/15/2018 Cancer Staging   Staging form: Corpus Uteri - Carcinoma and Carcinosarcoma, AJCC 8th Edition - Pathologic: Stage Unknown (pT1b, pNX, cM0) - Signed by Heath Lark, MD on 08/15/2018   08/22/2018 Procedure   Status post right IJ port catheter placement   08/23/2018 -  Chemotherapy   The patient had carboplatin and taxol for chemotherapy treatment.       REVIEW OF SYSTEMS:   Constitutional: Denies fevers, chills or abnormal weight loss Eyes: Denies blurriness of vision Ears, nose, mouth, throat, and face: Denies mucositis or sore throat Respiratory: Denies cough, dyspnea or wheezes Cardiovascular: Denies palpitation, chest discomfort or lower extremity swelling Skin: Denies abnormal skin rashes Lymphatics: Denies new lymphadenopathy or easy bruising Neurological:Denies numbness, tingling or new weaknesses Behavioral/Psych: Mood is stable, no new changes  All other systems were reviewed with the patient and are negative.  I have reviewed the past medical history,  past surgical history, social history and family history with the patient and they are unchanged from previous note.  ALLERGIES:  is allergic to cephalexin; losartan;  shellfish-derived products; sulfa antibiotics; serotonin; alendronate; beef-derived products; galactose; and lisinopril.  MEDICATIONS:  Current Outpatient Medications  Medication Sig Dispense Refill  . acetaminophen (TYLENOL) 500 MG tablet Take 500 mg by mouth every 6 (six) hours as needed.    . clonazePAM (KLONOPIN) 1 MG tablet 1 mg 2 (two) times daily.     Marland Kitchen dexamethasone (DECADRON) 4 MG tablet Take 2  tabs at the night before and 2 tab the morning of chemotherapy, every 3 weeks, by mouth 24 tablet 11  . EPINEPHrine 0.3 mg/0.3 mL IJ SOAJ injection epinephrine 0.3 mg/0.3 mL injection, auto-injector  USE AS DIRECTED    . lidocaine-prilocaine (EMLA) cream Apply to affected area once 30 g 3  . naproxen sodium (ALEVE) 220 MG tablet Take 220 mg by mouth.    . Nebivolol HCl (BYSTOLIC) 20 MG TABS Bystolic 20 mg tablet  TAKE ONE TABLET BY MOUTH DAILY    . nitroGLYCERIN (NITROSTAT) 0.4 MG SL tablet nitroglycerin 0.4 mg sublingual tablet    . omeprazole (PRILOSEC) 40 MG capsule Take 40 mg by mouth daily.     . ondansetron (ZOFRAN) 8 MG tablet Take 1 tablet (8 mg total) by mouth every 8 (eight) hours as needed for refractory nausea / vomiting. 30 tablet 1  . prochlorperazine (COMPAZINE) 10 MG tablet Take 1 tablet (10 mg total) by mouth every 6 (six) hours as needed (Nausea or vomiting). 30 tablet 1  . traMADol (ULTRAM) 50 MG tablet Take 2 tablets (100 mg total) by mouth every 6 (six) hours as needed. 60 tablet 0   No current facility-administered medications for this visit.    Facility-Administered Medications Ordered in Other Visits  Medication Dose Route Frequency Provider Last Rate Last Dose  . CARBOplatin (PARAPLATIN) 480 mg in sodium chloride 0.9 % 250 mL chemo infusion  480 mg Intravenous Once Alvy Bimler, Clyda Smyth, MD      . fosaprepitant (EMEND) 150 mg, dexamethasone (DECADRON) 12 mg in sodium chloride 0.9 % 145 mL IVPB   Intravenous Once Alvy Bimler, Lanya Bucks, MD      . heparin lock flush 100 unit/mL  500 Units  Intracatheter Once PRN Alvy Bimler, Kely Dohn, MD      . PACLitaxel (TAXOL) 234 mg in sodium chloride 0.9 % 250 mL chemo infusion (> 26m/m2)  140 mg/m2 (Treatment Plan Recorded) Intravenous Once Kateland Leisinger, MD      . sodium chloride flush (NS) 0.9 % injection 10 mL  10 mL Intracatheter PRN GAlvy Bimler Jamariyah Johannsen, MD        PHYSICAL EXAMINATION: ECOG PERFORMANCE STATUS: 1 - Symptomatic but completely ambulatory  Vitals:   10/31/18 1110  BP: 137/67  Pulse: 74  Resp: 18  Temp: 98.2 F (36.8 C)  SpO2: 98%   Filed Weights   10/31/18 1110  Weight: 146 lb 12.8 oz (66.6 kg)    GENERAL:alert, no distress and comfortable SKIN: skin color, texture, turgor are normal, no rashes or significant lesions EYES: normal, Conjunctiva are pink and non-injected, sclera clear OROPHARYNX:no exudate, no erythema and lips, buccal mucosa, and tongue normal  NECK: supple, thyroid normal size, non-tender, without nodularity LYMPH:  no palpable lymphadenopathy in the cervical, axillary or inguinal LUNGS: clear to auscultation and percussion with normal breathing effort HEART: regular rate & rhythm and no murmurs and no lower extremity edema ABDOMEN:abdomen soft, non-tender and normal bowel sounds  Musculoskeletal:no cyanosis of digits and no clubbing  NEURO: alert & oriented x 3 with fluent speech, no focal motor/sensory deficits  LABORATORY DATA:  I have reviewed the data as listed    Component Value Date/Time   NA 141 10/31/2018 1040   K 4.1 10/31/2018 1040   CL 105 10/31/2018 1040   CO2 24 10/31/2018 1040   GLUCOSE 141 (H) 10/31/2018 1040   BUN 15 10/31/2018 1040   CREATININE 0.84 10/31/2018 1040   CALCIUM 10.0 10/31/2018 1040   PROT 7.9 10/31/2018 1040   ALBUMIN 4.2 10/31/2018 1040   AST 22 10/31/2018 1040   ALT 31 10/31/2018 1040   ALKPHOS 83 10/31/2018 1040   BILITOT 0.3 10/31/2018 1040   GFRNONAA >60 10/31/2018 1040   GFRAA >60 10/31/2018 1040    No results found for: SPEP, UPEP  Lab Results   Component Value Date   WBC 6.1 10/31/2018   NEUTROABS 4.8 10/31/2018   HGB 10.2 (L) 10/31/2018   HCT 31.5 (L) 10/31/2018   MCV 98.7 10/31/2018   PLT 255 10/31/2018      Chemistry      Component Value Date/Time   NA 141 10/31/2018 1040   K 4.1 10/31/2018 1040   CL 105 10/31/2018 1040   CO2 24 10/31/2018 1040   BUN 15 10/31/2018 1040   CREATININE 0.84 10/31/2018 1040      Component Value Date/Time   CALCIUM 10.0 10/31/2018 1040   ALKPHOS 83 10/31/2018 1040   AST 22 10/31/2018 1040   ALT 31 10/31/2018 1040   BILITOT 0.3 10/31/2018 1040       All questions were answered. The patient knows to call the clinic with any problems, questions or concerns. No barriers to learning was detected.  I spent 15 minutes counseling the patient face to face. The total time spent in the appointment was 20 minutes and more than 50% was on counseling and review of test results  Heath Lark, MD 10/31/2018 11:53 AM

## 2018-10-31 NOTE — Assessment & Plan Note (Signed)
She has some mild nausea but no vomiting I recommend antiemetics I refill her prescription

## 2018-10-31 NOTE — Patient Instructions (Signed)

## 2018-10-31 NOTE — Progress Notes (Signed)
Informed Lam[place] of follow up appointment with Dr. Denman George on 01/12/19 at 12 pm.  She verbalized agreement.

## 2018-10-31 NOTE — Telephone Encounter (Signed)
Shirley from radiation called and scheduled the patient for a follow up appt. She will contact the patient.

## 2018-10-31 NOTE — Telephone Encounter (Signed)
CALLED PATIENT TO INFORM OF FU WITH DR. Denman George ON 12-15-18 - ARRIVAL TIME- 2:30 PM FOR 3PM APPT., AND FU WITH DR. KINARD ON 03/29/19 @ 4 PM, LVM FOR A RETURN CALL

## 2018-11-01 ENCOUNTER — Telehealth: Payer: Self-pay | Admitting: *Deleted

## 2018-11-01 NOTE — Telephone Encounter (Signed)
Returned the patient's call and confirmed for 01/12/2019 at 12pm

## 2018-11-06 ENCOUNTER — Telehealth: Payer: Self-pay | Admitting: Oncology

## 2018-11-06 NOTE — Telephone Encounter (Signed)
Called Christina Hickman back with message below.  She said she will take 4 mg of decadron now and again at 12 and call back with how she is feeling.

## 2018-11-06 NOTE — Telephone Encounter (Signed)
Christina Hickman called and said she had chemo last Tuesday and has had nausea and vomiting since Friday.  She is taking compazine and Zofran (rotating every 4-6 hours) and they are not helping.  She said the pills come right back up and that she has not been able to eat or drink anything without vomiting. She is wondering if Dr. Alvy Bimler can send in something to her pharmacy ([facility]) in [place].

## 2018-11-06 NOTE — Telephone Encounter (Signed)
Called Christina Hickman back to see how she is feeling.  She said she is doing much better.  She was able to eat some chicken noodle soup with crackers and is sipping Gatorade.  Advised her that she can take decadron 4 mg again tomorrow in the morning and again at 12 noon.  Also that she can alternate Zofran and compazine as needed.  Discussed that I will call her tomorrow to check on her and that she can call if she starts to feel bad again.

## 2018-11-06 NOTE — Telephone Encounter (Signed)
I am concerned about dehydration She can take dexamethasone 4 mg in the morning and again at 12 pm That usually helps But if not, she needs to be seen at symptom management for IVF and IV antiemetics

## 2018-11-08 NOTE — Telephone Encounter (Signed)
Christina Hickman and she is feeling good.  She is alternating compazine and zofran which is helping.  She said she is able to eat and drink and had a normal bowel movement this morning.

## 2018-11-21 ENCOUNTER — Inpatient Hospital Stay: Payer: Medicare Other

## 2018-11-21 ENCOUNTER — Telehealth: Payer: Self-pay | Admitting: *Deleted

## 2018-11-21 ENCOUNTER — Other Ambulatory Visit: Payer: Self-pay

## 2018-11-21 ENCOUNTER — Telehealth: Payer: Self-pay | Admitting: Oncology

## 2018-11-21 ENCOUNTER — Other Ambulatory Visit: Payer: Self-pay | Admitting: Hematology and Oncology

## 2018-11-21 ENCOUNTER — Inpatient Hospital Stay: Payer: Medicare Other | Attending: Gynecologic Oncology

## 2018-11-21 ENCOUNTER — Encounter: Payer: Self-pay | Admitting: Hematology and Oncology

## 2018-11-21 ENCOUNTER — Inpatient Hospital Stay (HOSPITAL_BASED_OUTPATIENT_CLINIC_OR_DEPARTMENT_OTHER): Payer: Medicare Other | Admitting: Hematology and Oncology

## 2018-11-21 VITALS — BP 152/69 | HR 71 | Temp 98.0°F | Resp 17 | Ht 59.0 in | Wt 141.8 lb

## 2018-11-21 DIAGNOSIS — E876 Hypokalemia: Secondary | ICD-10-CM

## 2018-11-21 DIAGNOSIS — G620 Drug-induced polyneuropathy: Secondary | ICD-10-CM | POA: Insufficient documentation

## 2018-11-21 DIAGNOSIS — R634 Abnormal weight loss: Secondary | ICD-10-CM | POA: Diagnosis not present

## 2018-11-21 DIAGNOSIS — C549 Malignant neoplasm of corpus uteri, unspecified: Secondary | ICD-10-CM

## 2018-11-21 DIAGNOSIS — D61818 Other pancytopenia: Secondary | ICD-10-CM

## 2018-11-21 DIAGNOSIS — T451X5A Adverse effect of antineoplastic and immunosuppressive drugs, initial encounter: Secondary | ICD-10-CM | POA: Diagnosis not present

## 2018-11-21 DIAGNOSIS — D539 Nutritional anemia, unspecified: Secondary | ICD-10-CM | POA: Insufficient documentation

## 2018-11-21 DIAGNOSIS — Z79899 Other long term (current) drug therapy: Secondary | ICD-10-CM | POA: Insufficient documentation

## 2018-11-21 DIAGNOSIS — C55 Malignant neoplasm of uterus, part unspecified: Secondary | ICD-10-CM | POA: Insufficient documentation

## 2018-11-21 DIAGNOSIS — C541 Malignant neoplasm of endometrium: Secondary | ICD-10-CM | POA: Diagnosis not present

## 2018-11-21 DIAGNOSIS — R110 Nausea: Secondary | ICD-10-CM

## 2018-11-21 LAB — CBC WITH DIFFERENTIAL/PLATELET
Abs Immature Granulocytes: 0.02 10*3/uL (ref 0.00–0.07)
Basophils Absolute: 0 10*3/uL (ref 0.0–0.1)
Basophils Relative: 0 %
Eosinophils Absolute: 0 10*3/uL (ref 0.0–0.5)
Eosinophils Relative: 0 %
HCT: 23.4 % — ABNORMAL LOW (ref 36.0–46.0)
Hemoglobin: 7.8 g/dL — ABNORMAL LOW (ref 12.0–15.0)
Immature Granulocytes: 1 %
Lymphocytes Relative: 19 %
Lymphs Abs: 0.7 10*3/uL (ref 0.7–4.0)
MCH: 33.2 pg (ref 26.0–34.0)
MCHC: 33.3 g/dL (ref 30.0–36.0)
MCV: 99.6 fL (ref 80.0–100.0)
Monocytes Absolute: 0.3 10*3/uL (ref 0.1–1.0)
Monocytes Relative: 9 %
Neutro Abs: 2.5 10*3/uL (ref 1.7–7.7)
Neutrophils Relative %: 71 %
Platelets: 35 10*3/uL — ABNORMAL LOW (ref 150–400)
RBC: 2.35 MIL/uL — ABNORMAL LOW (ref 3.87–5.11)
RDW: 18.8 % — ABNORMAL HIGH (ref 11.5–15.5)
WBC: 3.4 10*3/uL — ABNORMAL LOW (ref 4.0–10.5)
nRBC: 0 % (ref 0.0–0.2)

## 2018-11-21 LAB — CBC WITH DIFFERENTIAL (CANCER CENTER ONLY)
Abs Immature Granulocytes: 0.01 10*3/uL (ref 0.00–0.07)
Basophils Absolute: 0 10*3/uL (ref 0.0–0.1)
Basophils Relative: 0 %
Eosinophils Absolute: 0 10*3/uL (ref 0.0–0.5)
Eosinophils Relative: 0 %
HCT: 23.1 % — ABNORMAL LOW (ref 36.0–46.0)
Hemoglobin: 7.8 g/dL — ABNORMAL LOW (ref 12.0–15.0)
Immature Granulocytes: 0 %
Lymphocytes Relative: 24 %
Lymphs Abs: 0.8 10*3/uL (ref 0.7–4.0)
MCH: 32.9 pg (ref 26.0–34.0)
MCHC: 33.8 g/dL (ref 30.0–36.0)
MCV: 97.5 fL (ref 80.0–100.0)
Monocytes Absolute: 0.2 10*3/uL (ref 0.1–1.0)
Monocytes Relative: 6 %
Neutro Abs: 2.3 10*3/uL (ref 1.7–7.7)
Neutrophils Relative %: 70 %
Platelet Count: 35 10*3/uL — ABNORMAL LOW (ref 150–400)
RBC: 2.37 MIL/uL — ABNORMAL LOW (ref 3.87–5.11)
RDW: 18.6 % — ABNORMAL HIGH (ref 11.5–15.5)
WBC Count: 3.4 10*3/uL — ABNORMAL LOW (ref 4.0–10.5)
nRBC: 0 % (ref 0.0–0.2)

## 2018-11-21 LAB — CMP (CANCER CENTER ONLY)
ALT: 18 U/L (ref 0–44)
AST: 19 U/L (ref 15–41)
Albumin: 3.7 g/dL (ref 3.5–5.0)
Alkaline Phosphatase: 70 U/L (ref 38–126)
Anion gap: 16 — ABNORMAL HIGH (ref 5–15)
BUN: 10 mg/dL (ref 8–23)
CO2: 21 mmol/L — ABNORMAL LOW (ref 22–32)
Calcium: 8.7 mg/dL — ABNORMAL LOW (ref 8.9–10.3)
Chloride: 103 mmol/L (ref 98–111)
Creatinine: 0.81 mg/dL (ref 0.44–1.00)
GFR, Est AFR Am: >60 mL/min (ref >60)
GFR, Estimated: >60 mL/min (ref >60)
Glucose, Bld: 175 mg/dL — ABNORMAL HIGH (ref 70–99)
Potassium: 3.1 mmol/L — ABNORMAL LOW (ref 3.5–5.1)
Sodium: 140 mmol/L (ref 135–145)
Total Bilirubin: 0.5 mg/dL (ref 0.3–1.2)
Total Protein: 7.1 g/dL (ref 6.5–8.1)

## 2018-11-21 LAB — IRON AND TIBC
Iron: 101 ug/dL (ref 41–142)
Saturation Ratios: 27 % (ref 21–57)
TIBC: 373 ug/dL (ref 236–444)
UIBC: 272 ug/dL (ref 120–384)

## 2018-11-21 LAB — FERRITIN: Ferritin: 230 ng/mL (ref 11–307)

## 2018-11-21 LAB — SAMPLE TO BLOOD BANK

## 2018-11-21 LAB — SEDIMENTATION RATE: Sed Rate: 95 mm/hr — ABNORMAL HIGH (ref 0–22)

## 2018-11-21 LAB — VITAMIN B12: Vitamin B-12: 169 pg/mL — ABNORMAL LOW (ref 180–914)

## 2018-11-21 MED ORDER — SODIUM CHLORIDE 0.9% FLUSH
10.0000 mL | Freq: Once | INTRAVENOUS | Status: DC
  Filled 2018-11-21: qty 10

## 2018-11-21 MED ORDER — HEPARIN SOD (PORK) LOCK FLUSH 100 UNIT/ML IV SOLN
250.0000 [IU] | Freq: Once | INTRAVENOUS | Status: AC
  Administered 2018-11-21: 10:00:00 250 [IU]
  Filled 2018-11-21: qty 5

## 2018-11-21 MED ORDER — SODIUM CHLORIDE 0.9% FLUSH
10.0000 mL | Freq: Once | INTRAVENOUS | Status: AC
  Administered 2018-11-21: 10 mL
  Filled 2018-11-21: qty 10

## 2018-11-21 MED ORDER — DEXAMETHASONE 4 MG PO TABS: ORAL_TABLET | ORAL | 11 refills | Status: AC

## 2018-11-21 MED FILL — DEXAMETHASONE 4 MG TABLET: 4 | 84 days supply | Qty: 28 | Fill #0

## 2018-11-21 NOTE — Telephone Encounter (Signed)
Drusilla Kanner and she said she lost her paper that Dr. Alvy Bimler had written the decadron instructions on.  Reviewed instructions with her. Also notified her that her B12 level was low per Dr. Calton Dach message and to start taking B12 1000 mcg oral daily.  She verbalized understanding and agreement.

## 2018-11-21 NOTE — Assessment & Plan Note (Signed)
She has hypokalemia due to poor oral intake We discussed potassium rich diet I prefer not to prescribe oral potassium replacement due to ongoing nausea

## 2018-11-21 NOTE — Assessment & Plan Note (Signed)
She continues to have profound nausea and vomiting with last cycle of treatment I recommend addition of daily dexamethasone after chemotherapy in the future to help prevent anticipatory nausea She will continue antiemetics She continues to lose weight with treatment and I will adjust the dose of her chemotherapy accordingly

## 2018-11-21 NOTE — Telephone Encounter (Signed)
-----   Message from Heath Lark, MD sent at 11/21/2018 11:55 AM EDT ----- Regarding: Vitamin B12 def Labs confirmed vitamin B12 def; that's why she has severe pancytopenia I recommend she purchase vitamin B12 oral OTC at 1000 mcg at local pharmacy When she returns end of next week we will also give her high dose vitamin b12 injection

## 2018-11-21 NOTE — Telephone Encounter (Signed)
Telephone call to patient x 3. No answer. No voicemail. Will attempt again later.

## 2018-11-21 NOTE — Progress Notes (Signed)
[place] OFFICE PROGRESS NOTE  Patient Care Team: [name], DO as PCP - General (Family Medicine)  ASSESSMENT & PLAN:  Uterine cancer [facility]) She continues to tolerate treatment poorly with progressive pancytopenia, weight loss, electrolyte imbalance and nausea I will cancel her treatment today I will reschedule her next treatment to about 10 days time I plan to further reduce the dose of chemotherapy  Pancytopenia, acquired ([place]) She has progressive pancytopenia due to treatment I recommend holding treatment today We discussed the risk and benefits of transfusion support She is not symptomatic We will hold transfusion until her next visit I plan to reduce the dose of chemotherapy further for the last 2 cycles I have also ordered additional work-up for anemia to rule out nutritional deficiency  Hypokalemia She has hypokalemia due to poor oral intake We discussed potassium rich diet I prefer not to prescribe oral potassium replacement due to ongoing nausea  Nausea without vomiting She continues to have profound nausea and vomiting with last cycle of treatment I recommend addition of daily dexamethasone after chemotherapy in the future to help prevent anticipatory nausea She will continue antiemetics She continues to lose weight with treatment and I will adjust the dose of her chemotherapy accordingly   No orders of the defined types were placed in this encounter.   INTERVAL HISTORY: Please see below for problem oriented charting. She returns for cycle 5 of treatment She developed profound nausea and vomiting after last cycle of treatment She has lost more weight She denies fever or chills She has occasional hemorrhoids but not severe She denies chest pain, shortness of breath of syncopal episode Denies recent constipation No worsening peripheral neuropathy  SUMMARY OF ONCOLOGIC HISTORY: Oncology History Overview Note  Her2 positive    Uterine cancer ([place])  06/06/2018 Initial Diagnosis   She presented with postmenopausal bleeding. Initial biopsy was negative for malignancy   06/06/2018 Pathology Results   She saw Dr Adah Perl in [place] on 06/06/18 and a pap was performed which showed ASCUS.   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 The left tube and ovary was adherent to the sigmoid colon and was not removed.    08/11/2018 Initial Diagnosis   Uterine cancer ([place])   08/15/2018 Imaging   CT scan of chest, abdomen and pelvis 1. Single indeterminate 8 mm aorto-caval retroperitoneal lymph node. No other signs metastatic disease within the chest, abdomen, or pelvis. 2. Moderate to large hiatal hernia. 3. Colonic diverticulosis. No radiographic evidence of diverticulitis.   08/15/2018 Cancer Staging   Staging form: Corpus Uteri - Carcinoma and Carcinosarcoma, AJCC 8th Edition - Pathologic: Stage Unknown (pT1b, pNX, cM0) - Signed by Heath Lark, MD on 08/15/2018   08/22/2018 Procedure   Status post right IJ port catheter placement   08/23/2018 -  Chemotherapy   The patient had carboplatin and taxol for chemotherapy treatment.       REVIEW OF SYSTEMS:   Constitutional: Denies fevers, chills or abnormal weight loss Eyes: Denies blurriness of vision Ears, nose, mouth, throat, and face:  Denies mucositis or sore throat Respiratory: Denies cough, dyspnea or wheezes Cardiovascular: Denies palpitation, chest discomfort or lower  extremity swelling Skin: Denies abnormal skin rashes Lymphatics: Denies new lymphadenopathy or easy bruising Neurological:Denies numbness, tingling or new weaknesses Behavioral/Psych: Mood is stable, no new changes  All other systems were reviewed with the patient and are negative.  I have reviewed the past medical history, past surgical history, social history and family history with the patient and they are unchanged from previous note.  ALLERGIES:  is allergic to cephalexin; losartan; shellfish-derived products; sulfa antibiotics; serotonin; alendronate; beef-derived products; galactose; and lisinopril.  MEDICATIONS:  Current Outpatient Medications  Medication Sig Dispense Refill  . acetaminophen (TYLENOL) 500 MG tablet Take 500 mg by mouth every 6 (six) hours as needed.    . clonazePAM (KLONOPIN) 1 MG tablet 1 mg 2 (two) times daily.     Marland Kitchen dexamethasone (DECADRON) 4 MG tablet Take 2  tabs at the night before and 2 tab the morning of chemotherapy. After chemo, take 1 daily for 3 days in the morning with breakfast, every 3 weeks 30 tablet 11  . EPINEPHrine 0.3 mg/0.3 mL IJ SOAJ injection epinephrine 0.3 mg/0.3 mL injection, auto-injector  USE AS DIRECTED    . lidocaine-prilocaine (EMLA) cream Apply to affected area once 30 g 3  . naproxen sodium (ALEVE) 220 MG tablet Take 220 mg by mouth.    . Nebivolol HCl (BYSTOLIC) 20 MG TABS Bystolic 20 mg tablet  TAKE ONE TABLET BY MOUTH DAILY    . nitroGLYCERIN (NITROSTAT) 0.4 MG SL tablet nitroglycerin 0.4 mg sublingual tablet    . omeprazole (PRILOSEC) 40 MG capsule Take 40 mg by mouth daily.     . ondansetron (ZOFRAN) 8 MG tablet Take 1 tablet (8 mg total) by mouth every 8 (eight) hours as needed for refractory nausea / vomiting. 30 tablet 1  . prochlorperazine (COMPAZINE) 10 MG tablet Take 1 tablet (10 mg total) by mouth every 6 (six) hours as needed (Nausea or vomiting). 30 tablet 1  . traMADol (ULTRAM) 50 MG tablet Take 2 tablets (100 mg  total) by mouth every 6 (six) hours as needed. 60 tablet 0   Current Facility-Administered Medications  Medication Dose Route Frequency Provider Last Rate Last Dose  . sodium chloride flush (NS) 0.9 % injection 10 mL  10 mL Intracatheter Once Heath Lark, MD        PHYSICAL EXAMINATION: ECOG PERFORMANCE STATUS: 2 - Symptomatic, <50% confined to bed  Vitals:   11/21/18 0915  BP: (!) 152/69  Pulse: 71  Resp: 17  Temp: 98 F (36.7 C)  SpO2: 100%   Filed Weights   11/21/18 0915  Weight: 141 lb 12.8 oz (64.3 kg)    GENERAL:alert, no distress and comfortable.  She looks a bit pale SKIN: skin color, texture, turgor are normal, no rashes or significant lesions EYES: normal, Conjunctiva are pink and non-injected, sclera clear OROPHARYNX:no exudate, no erythema and lips, buccal mucosa, and tongue normal  NECK: supple, thyroid normal size, non-tender, without nodularity LYMPH:  no palpable lymphadenopathy in the cervical, axillary or inguinal LUNGS: clear to auscultation and percussion with normal breathing effort HEART: regular rate & rhythm and no murmurs and no lower extremity edema ABDOMEN:abdomen soft, non-tender and normal bowel sounds Musculoskeletal:no cyanosis of digits and no clubbing  NEURO: alert & oriented x 3 with fluent speech, no focal motor/sensory deficits  LABORATORY DATA:  I have reviewed the data as listed    Component Value  Date/Time   NA 140 11/21/2018 0858   K 3.1 (L) 11/21/2018 0858   CL 103 11/21/2018 0858   CO2 21 (L) 11/21/2018 0858   GLUCOSE 175 (H) 11/21/2018 0858   BUN 10 11/21/2018 0858   CREATININE 0.81 11/21/2018 0858   CALCIUM 8.7 (L) 11/21/2018 0858   PROT 7.1 11/21/2018 0858   ALBUMIN 3.7 11/21/2018 0858   AST 19 11/21/2018 0858   ALT 18 11/21/2018 0858   ALKPHOS 70 11/21/2018 0858   BILITOT 0.5 11/21/2018 0858   GFRNONAA >60 11/21/2018 0858   GFRAA >60 11/21/2018 0858    No results found for: SPEP, UPEP  Lab Results  Component  Value Date   WBC 3.4 (L) 11/21/2018   NEUTROABS 2.5 11/21/2018   HGB 7.8 (L) 11/21/2018   HCT 23.4 (L) 11/21/2018   MCV 99.6 11/21/2018   PLT 35 (L) 11/21/2018      Chemistry      Component Value Date/Time   NA 140 11/21/2018 0858   K 3.1 (L) 11/21/2018 0858   CL 103 11/21/2018 0858   CO2 21 (L) 11/21/2018 0858   BUN 10 11/21/2018 0858   CREATININE 0.81 11/21/2018 0858      Component Value Date/Time   CALCIUM 8.7 (L) 11/21/2018 0858   ALKPHOS 70 11/21/2018 0858   AST 19 11/21/2018 0858   ALT 18 11/21/2018 0858   BILITOT 0.5 11/21/2018 0858       All questions were answered. The patient knows to call the clinic with any problems, questions or concerns. No barriers to learning was detected.  I spent 30 minutes counseling the patient face to face. The total time spent in the appointment was 40 minutes and more than 50% was on counseling and review of test results  Heath Lark, MD 11/21/2018 10:37 AM

## 2018-11-21 NOTE — Assessment & Plan Note (Signed)
She continues to tolerate treatment poorly with progressive pancytopenia, weight loss, electrolyte imbalance and nausea I will cancel her treatment today I will reschedule her next treatment to about 10 days time I plan to further reduce the dose of chemotherapy

## 2018-11-21 NOTE — Assessment & Plan Note (Addendum)
She has progressive pancytopenia due to treatment I recommend holding treatment today We discussed the risk and benefits of transfusion support She is not symptomatic We will hold transfusion until her next visit I plan to reduce the dose of chemotherapy further for the last 2 cycles I have also ordered additional work-up for anemia to rule out nutritional deficiency

## 2018-11-22 ENCOUNTER — Telehealth: Payer: Self-pay | Admitting: *Deleted

## 2018-11-22 ENCOUNTER — Other Ambulatory Visit: Payer: Self-pay | Admitting: Hematology and Oncology

## 2018-11-22 ENCOUNTER — Telehealth: Payer: Self-pay | Admitting: Oncology

## 2018-11-22 ENCOUNTER — Telehealth: Payer: Self-pay | Admitting: Hematology and Oncology

## 2018-11-22 ENCOUNTER — Inpatient Hospital Stay: Payer: Medicare Other

## 2018-11-22 ENCOUNTER — Other Ambulatory Visit: Payer: Self-pay

## 2018-11-22 ENCOUNTER — Other Ambulatory Visit: Payer: Self-pay | Admitting: *Deleted

## 2018-11-22 VITALS — BP 151/83 | HR 58 | Temp 98.0°F | Resp 17

## 2018-11-22 DIAGNOSIS — C541 Malignant neoplasm of endometrium: Secondary | ICD-10-CM

## 2018-11-22 DIAGNOSIS — D6481 Anemia due to antineoplastic chemotherapy: Secondary | ICD-10-CM

## 2018-11-22 DIAGNOSIS — C55 Malignant neoplasm of uterus, part unspecified: Secondary | ICD-10-CM | POA: Diagnosis not present

## 2018-11-22 DIAGNOSIS — T451X5A Adverse effect of antineoplastic and immunosuppressive drugs, initial encounter: Secondary | ICD-10-CM

## 2018-11-22 DIAGNOSIS — C549 Malignant neoplasm of corpus uteri, unspecified: Secondary | ICD-10-CM

## 2018-11-22 LAB — PREPARE RBC (CROSSMATCH)

## 2018-11-22 LAB — ABO/RH: ABO/RH(D): O NEG

## 2018-11-22 LAB — FOLATE RBC
Folate, Hemolysate: 313 ng/mL
Folate, RBC: 1338 ng/mL (ref >498)
Hematocrit: 23.4 % — ABNORMAL LOW (ref 34.0–46.6)

## 2018-11-22 MED ORDER — SODIUM CHLORIDE 0.9% IV SOLUTION
250.0000 mL | Freq: Once | INTRAVENOUS | Status: AC
  Administered 2018-11-22: 250 mL via INTRAVENOUS
  Filled 2018-11-22: qty 250

## 2018-11-22 MED ORDER — ACETAMINOPHEN 325 MG PO TABS
ORAL_TABLET | ORAL | Status: AC
  Filled 2018-11-22: qty 2

## 2018-11-22 MED ORDER — SODIUM CHLORIDE 0.9% FLUSH
10.0000 mL | INTRAVENOUS | Status: AC | PRN
  Administered 2018-11-22: 10 mL
  Filled 2018-11-22: qty 10

## 2018-11-22 MED ORDER — ACETAMINOPHEN 325 MG PO TABS
650.0000 mg | ORAL_TABLET | Freq: Once | ORAL | Status: AC
  Administered 2018-11-22: 650 mg via ORAL

## 2018-11-22 MED ORDER — DIPHENHYDRAMINE HCL 25 MG PO CAPS
25.0000 mg | ORAL_CAPSULE | Freq: Once | ORAL | Status: AC
  Administered 2018-11-22: 25 mg via ORAL

## 2018-11-22 MED ORDER — HEPARIN SOD (PORK) LOCK FLUSH 100 UNIT/ML IV SOLN
250.0000 [IU] | INTRAVENOUS | Status: DC | PRN
  Filled 2018-11-22: qty 5

## 2018-11-22 MED ORDER — HEPARIN SOD (PORK) LOCK FLUSH 100 UNIT/ML IV SOLN
500.0000 [IU] | Freq: Once | INTRAVENOUS | Status: AC
  Administered 2018-11-22: 500 [IU]
  Filled 2018-11-22: qty 5

## 2018-11-22 MED ORDER — DIPHENHYDRAMINE HCL 25 MG PO CAPS
ORAL_CAPSULE | ORAL | Status: AC
  Filled 2018-11-22: qty 1

## 2018-11-22 NOTE — Telephone Encounter (Signed)
Scheduled appt per 10/20 sch message - pt to get an updated schedule in the treatment area - RN Kayla aware.

## 2018-11-22 NOTE — Telephone Encounter (Signed)
Telephone call to patient in regards to b12. She has started taking 1094mcg daily and understands she will get the injection at her next visit to this clinic.

## 2018-11-22 NOTE — Telephone Encounter (Signed)
Christina Hickman left a message saying that she is short of breath and can hardly go from chair to chair.  She also said that her anxiety is really bad.  She is wondering if she can come in for a blood transfusion today.

## 2018-11-22 NOTE — Patient Instructions (Signed)
Blood Transfusion, Adult, Care After This sheet gives you information about how to care for yourself after your procedure. Your doctor may also give you more specific instructions. If you have problems or questions, contact your doctor. Follow these instructions at home:   Take over-the-counter and prescription medicines only as told by your doctor.  Go back to your normal activities as told by your doctor.  Follow instructions from your doctor about how to take care of the area where an IV tube was put into your vein (insertion site). Make sure you: ? Wash your hands with soap and water before you change your bandage (dressing). If there is no soap and water, use hand sanitizer. ? Change your bandage as told by your doctor.  Check your IV insertion site every day for signs of infection. Check for: ? More redness, swelling, or pain. ? More fluid or blood. ? Warmth. ? Pus or a bad smell. Contact a doctor if:  You have more redness, swelling, or pain around the IV insertion site.  You have more fluid or blood coming from the IV insertion site.  Your IV insertion site feels warm to the touch.  You have pus or a bad smell coming from the IV insertion site.  Your pee (urine) turns pink, red, or brown.  You feel weak after doing your normal activities. Get help right away if:  You have signs of a serious allergic or body defense (immune) system reaction, including: ? Itchiness. ? Hives. ? Trouble breathing. ? Anxiety. ? Pain in your chest or lower back. ? Fever, flushing, and chills. ? Fast pulse. ? Rash. ? Watery poop (diarrhea). ? Throwing up (vomiting). ? Dark pee. ? Serious headache. ? Dizziness. ? Stiff neck. ? Yellow color in your face or the white parts of your eyes (jaundice). Summary  After a blood transfusion, return to your normal activities as told by your doctor.  Every day, check for signs of infection where the IV tube was put into your vein.  Some  signs of infection are warm skin, more redness and pain, more fluid or blood, and pus or a bad smell where the needle went in.  Contact your doctor if you feel weak or have any unusual symptoms. This information is not intended to replace advice given to you by your health care provider. Make sure you discuss any questions you have with your health care provider. Document Released: 02/08/2014 Document Revised: 05/25/2017 Document Reviewed: 09/12/2015 Elsevier Patient Education  2020 Elsevier Inc.  

## 2018-11-22 NOTE — Telephone Encounter (Signed)
Telephone call returned to patient. Spoke to husband and patient. They are aware of appointment at 1030 for 1 unit of blood and her B12 injection. Message sent to get authorization for injection. Charge nurse and infusion nurse notified.

## 2018-11-23 LAB — TYPE AND SCREEN
ABO/RH(D): O NEG
Antibody Screen: NEGATIVE
Unit division: 0

## 2018-11-23 LAB — BPAM RBC
Blood Product Expiration Date: 202011182359
ISSUE DATE / TIME: 202010211133
Unit Type and Rh: 9500

## 2018-12-01 ENCOUNTER — Inpatient Hospital Stay: Payer: Medicare Other

## 2018-12-01 ENCOUNTER — Inpatient Hospital Stay (HOSPITAL_BASED_OUTPATIENT_CLINIC_OR_DEPARTMENT_OTHER): Payer: Medicare Other | Admitting: Hematology and Oncology

## 2018-12-01 ENCOUNTER — Other Ambulatory Visit: Payer: Self-pay

## 2018-12-01 ENCOUNTER — Encounter: Payer: Self-pay | Admitting: Hematology and Oncology

## 2018-12-01 ENCOUNTER — Encounter: Payer: Self-pay | Admitting: Oncology

## 2018-12-01 DIAGNOSIS — T451X5A Adverse effect of antineoplastic and immunosuppressive drugs, initial encounter: Secondary | ICD-10-CM

## 2018-12-01 DIAGNOSIS — D61818 Other pancytopenia: Secondary | ICD-10-CM | POA: Diagnosis not present

## 2018-12-01 DIAGNOSIS — R110 Nausea: Secondary | ICD-10-CM

## 2018-12-01 DIAGNOSIS — C541 Malignant neoplasm of endometrium: Secondary | ICD-10-CM

## 2018-12-01 DIAGNOSIS — G620 Drug-induced polyneuropathy: Secondary | ICD-10-CM | POA: Diagnosis not present

## 2018-12-01 DIAGNOSIS — C549 Malignant neoplasm of corpus uteri, unspecified: Secondary | ICD-10-CM

## 2018-12-01 DIAGNOSIS — C55 Malignant neoplasm of uterus, part unspecified: Secondary | ICD-10-CM | POA: Diagnosis not present

## 2018-12-01 DIAGNOSIS — D539 Nutritional anemia, unspecified: Secondary | ICD-10-CM

## 2018-12-01 LAB — SAMPLE TO BLOOD BANK

## 2018-12-01 LAB — CMP (CANCER CENTER ONLY)
ALT: 28 U/L (ref 0–44)
AST: 18 U/L (ref 15–41)
Albumin: 4.1 g/dL (ref 3.5–5.0)
Alkaline Phosphatase: 72 U/L (ref 38–126)
Anion gap: 14 (ref 5–15)
BUN: 25 mg/dL — ABNORMAL HIGH (ref 8–23)
CO2: 20 mmol/L — ABNORMAL LOW (ref 22–32)
Calcium: 9.6 mg/dL (ref 8.9–10.3)
Chloride: 107 mmol/L (ref 98–111)
Creatinine: 0.75 mg/dL (ref 0.44–1.00)
GFR, Est AFR Am: >60 mL/min (ref >60)
GFR, Estimated: >60 mL/min (ref >60)
Glucose, Bld: 145 mg/dL — ABNORMAL HIGH (ref 70–99)
Potassium: 3.5 mmol/L (ref 3.5–5.1)
Sodium: 141 mmol/L (ref 135–145)
Total Bilirubin: 0.3 mg/dL (ref 0.3–1.2)
Total Protein: 7.7 g/dL (ref 6.5–8.1)

## 2018-12-01 LAB — CBC WITH DIFFERENTIAL (CANCER CENTER ONLY)
Abs Immature Granulocytes: 0.03 10*3/uL (ref 0.00–0.07)
Basophils Absolute: 0 10*3/uL (ref 0.0–0.1)
Basophils Relative: 0 %
Eosinophils Absolute: 0 10*3/uL (ref 0.0–0.5)
Eosinophils Relative: 0 %
HCT: 32.3 % — ABNORMAL LOW (ref 36.0–46.0)
Hemoglobin: 10.9 g/dL — ABNORMAL LOW (ref 12.0–15.0)
Immature Granulocytes: 0 %
Lymphocytes Relative: 17 %
Lymphs Abs: 1.5 10*3/uL (ref 0.7–4.0)
MCH: 33.7 pg (ref 26.0–34.0)
MCHC: 33.7 g/dL (ref 30.0–36.0)
MCV: 100 fL (ref 80.0–100.0)
Monocytes Absolute: 0.6 10*3/uL (ref 0.1–1.0)
Monocytes Relative: 7 %
Neutro Abs: 6.9 10*3/uL (ref 1.7–7.7)
Neutrophils Relative %: 76 %
Platelet Count: 224 10*3/uL (ref 150–400)
RBC: 3.23 MIL/uL — ABNORMAL LOW (ref 3.87–5.11)
RDW: 18.7 % — ABNORMAL HIGH (ref 11.5–15.5)
WBC Count: 9.1 10*3/uL (ref 4.0–10.5)
nRBC: 0 % (ref 0.0–0.2)

## 2018-12-01 MED ORDER — FAMOTIDINE IN NACL 20-0.9 MG/50ML-% IV SOLN
20.0000 mg | Freq: Once | INTRAVENOUS | Status: AC
  Administered 2018-12-01: 20 mg via INTRAVENOUS

## 2018-12-01 MED ORDER — SODIUM CHLORIDE 0.9 % IV SOLN
Freq: Once | INTRAVENOUS | Status: AC
  Administered 2018-12-01: 12:00:00 via INTRAVENOUS
  Filled 2018-12-01: qty 250

## 2018-12-01 MED ORDER — SODIUM CHLORIDE 0.9 % IV SOLN
105.0000 mg/m2 | Freq: Once | INTRAVENOUS | Status: AC
  Administered 2018-12-01: 174 mg via INTRAVENOUS
  Filled 2018-12-01: qty 29

## 2018-12-01 MED ORDER — CYANOCOBALAMIN 1000 MCG/ML IJ SOLN
INTRAMUSCULAR | Status: AC
  Filled 2018-12-01: qty 1

## 2018-12-01 MED ORDER — HEPARIN SOD (PORK) LOCK FLUSH 100 UNIT/ML IV SOLN
500.0000 [IU] | Freq: Once | INTRAVENOUS | Status: AC | PRN
  Administered 2018-12-01: 500 [IU]
  Filled 2018-12-01: qty 5

## 2018-12-01 MED ORDER — CYANOCOBALAMIN 1000 MCG/ML IJ SOLN
1000.0000 ug | Freq: Once | INTRAMUSCULAR | Status: AC
  Administered 2018-12-01: 11:00:00 1000 ug via INTRAMUSCULAR

## 2018-12-01 MED ORDER — DIPHENHYDRAMINE HCL 50 MG/ML IJ SOLN
INTRAMUSCULAR | Status: AC
  Filled 2018-12-01: qty 1

## 2018-12-01 MED ORDER — SODIUM CHLORIDE 0.9 % IV SOLN
404.0000 mg | Freq: Once | INTRAVENOUS | Status: AC
  Administered 2018-12-01: 400 mg via INTRAVENOUS
  Filled 2018-12-01: qty 40

## 2018-12-01 MED ORDER — SODIUM CHLORIDE 0.9% FLUSH
10.0000 mL | INTRAVENOUS | Status: DC | PRN
  Administered 2018-12-01: 10 mL
  Filled 2018-12-01: qty 10

## 2018-12-01 MED ORDER — SODIUM CHLORIDE 0.9% FLUSH
10.0000 mL | Freq: Once | INTRAVENOUS | Status: AC
  Administered 2018-12-01: 10 mL
  Filled 2018-12-01: qty 10

## 2018-12-01 MED ORDER — PALONOSETRON HCL INJECTION 0.25 MG/5ML
0.2500 mg | Freq: Once | INTRAVENOUS | Status: AC
  Administered 2018-12-01: 0.25 mg via INTRAVENOUS

## 2018-12-01 MED ORDER — SODIUM CHLORIDE 0.9 % IV SOLN
Freq: Once | INTRAVENOUS | Status: AC
  Administered 2018-12-01: 13:00:00 via INTRAVENOUS
  Filled 2018-12-01: qty 5

## 2018-12-01 MED ORDER — DIPHENHYDRAMINE HCL 50 MG/ML IJ SOLN
50.0000 mg | Freq: Once | INTRAMUSCULAR | Status: AC
  Administered 2018-12-01: 12:00:00 50 mg via INTRAVENOUS

## 2018-12-01 MED ORDER — FAMOTIDINE IN NACL 20-0.9 MG/50ML-% IV SOLN
INTRAVENOUS | Status: AC
  Filled 2018-12-01: qty 50

## 2018-12-01 MED ORDER — PALONOSETRON HCL INJECTION 0.25 MG/5ML
INTRAVENOUS | Status: AC
  Filled 2018-12-01: qty 5

## 2018-12-01 NOTE — Assessment & Plan Note (Signed)
Her pancytopenia was multifactorial secondary to side effects of chemotherapy and vitamin B12 deficiency She has received blood transfusion recently and has been taking vitamin B12 supplementation Her pancytopenia has almost completely resolved We will proceed with treatment as schedule I recommend her to continue oral vitamin B12 supplementation for now

## 2018-12-01 NOTE — Patient Instructions (Signed)

## 2018-12-01 NOTE — Progress Notes (Signed)
Met with Vietta in the infusion room.  Discussed dilator purpose and instructions.  Also reviewed taking 1 tablet of decadron for the next three days after chemo for nausea.  She verbalized understanding and agreement.

## 2018-12-01 NOTE — Assessment & Plan Note (Signed)
This is resolved She will take antiemetics as needed

## 2018-12-01 NOTE — Progress Notes (Signed)
[place] OFFICE PROGRESS NOTE  Patient Care Team: [name], DO as PCP - General (Family Medicine)  ASSESSMENT & PLAN:  Uterine cancer [facility]) She has recovered fully from side effects of recent treatment We will proceed with minor dose adjustment due to her recent severe pancytopenia She will proceed with treatment as schedule After cycle 6, we will proceed with imaging study  Pancytopenia, acquired [facility]) Her pancytopenia was multifactorial secondary to side effects of chemotherapy and vitamin B12 deficiency She has received blood transfusion recently and has been taking vitamin B12 supplementation Her pancytopenia has almost completely resolved We will proceed with treatment as schedule I recommend her to continue oral vitamin B12 supplementation for now  Nausea without vomiting This is resolved She will take antiemetics as needed  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  As above, we will proceed with dose adjustment of treatment.   No orders of the defined types were placed in this encounter.   INTERVAL HISTORY: Please see below for problem oriented charting. She returns for further follow-up She is doing very well since we held her last treatment and give her blood transfusion She has no further nausea vomiting Peripheral neuropathy is not bothering her She have excellent energy level No recent bleeding  SUMMARY OF ONCOLOGIC HISTORY: Oncology History Overview Note  Her2 positive   Uterine cancer ([place])  06/06/2018 Initial Diagnosis   She presented with postmenopausal bleeding. Initial biopsy was negative for malignancy   06/06/2018 Pathology Results   She saw Dr Adah Perl in [place] on 06/06/18 and a pap was performed which showed ASCUS.   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 The left tube and ovary was adherent to the sigmoid colon and was not removed.    08/11/2018 Initial Diagnosis   Uterine cancer ([place])   08/15/2018 Imaging   CT scan of chest, abdomen and pelvis 1. Single indeterminate 8 mm aorto-caval retroperitoneal lymph node. No other signs metastatic disease within the chest, abdomen, or pelvis. 2. Moderate to large hiatal hernia. 3. Colonic diverticulosis. No radiographic evidence of diverticulitis.   08/15/2018 Cancer Staging   Staging form: Corpus Uteri - Carcinoma and Carcinosarcoma, AJCC 8th Edition - Pathologic: Stage Unknown (pT1b, pNX, cM0) - Signed by Heath Lark, MD on 08/15/2018   08/22/2018 Procedure   Status post right IJ port catheter placement   08/23/2018 -  Chemotherapy   The patient had carboplatin and taxol for chemotherapy treatment.       REVIEW OF SYSTEMS:   Constitutional: Denies fevers, chills or abnormal weight loss Eyes: Denies blurriness of vision Ears, nose, mouth, throat, and face: Denies mucositis or sore throat Respiratory: Denies cough, dyspnea or wheezes Cardiovascular: Denies palpitation, chest discomfort or lower extremity swelling Gastrointestinal:  Denies nausea, heartburn or change in bowel habits Skin: Denies abnormal skin rashes Lymphatics: Denies new lymphadenopathy or easy bruising Neurological:Denies numbness, tingling or new weaknesses Behavioral/Psych: Mood is stable, no new changes  All other systems were reviewed with the patient and are negative.  I  have reviewed the past medical history, past surgical history, social history and  family history with the patient and they are unchanged from previous note.  ALLERGIES:  is allergic to cephalexin; losartan; shellfish-derived products; sulfa antibiotics; serotonin; alendronate; beef-derived products; galactose; and lisinopril.  MEDICATIONS:  Current Outpatient Medications  Medication Sig Dispense Refill  . acetaminophen (TYLENOL) 500 MG tablet Take 500 mg by mouth every 6 (six) hours as needed.    . clonazePAM (KLONOPIN) 1 MG tablet 1 mg 2 (two) times daily.     Marland Kitchen dexamethasone (DECADRON) 4 MG tablet Take 2  tabs at the night before and 2 tab the morning of chemotherapy. After chemo, take 1 daily for 3 days in the morning with breakfast, every 3 weeks 30 tablet 11  . EPINEPHrine 0.3 mg/0.3 mL IJ SOAJ injection epinephrine 0.3 mg/0.3 mL injection, auto-injector  USE AS DIRECTED    . lidocaine-prilocaine (EMLA) cream Apply to affected area once 30 g 3  . naproxen sodium (ALEVE) 220 MG tablet Take 220 mg by mouth.    . Nebivolol HCl (BYSTOLIC) 20 MG TABS Bystolic 20 mg tablet  TAKE ONE TABLET BY MOUTH DAILY    . nitroGLYCERIN (NITROSTAT) 0.4 MG SL tablet nitroglycerin 0.4 mg sublingual tablet    . omeprazole (PRILOSEC) 40 MG capsule Take 40 mg by mouth daily.     . ondansetron (ZOFRAN) 8 MG tablet Take 1 tablet (8 mg total) by mouth every 8 (eight) hours as needed for refractory nausea / vomiting. 30 tablet 1  . prochlorperazine (COMPAZINE) 10 MG tablet Take 1 tablet (10 mg total) by mouth every 6 (six) hours as needed (Nausea or vomiting). 30 tablet 1  . traMADol (ULTRAM) 50 MG tablet Take 2 tablets (100 mg total) by mouth every 6 (six) hours as needed. 60 tablet 0   No current facility-administered medications for this visit.    Facility-Administered Medications Ordered in Other Visits  Medication Dose Route Frequency Provider Last Rate Last Dose  . CARBOplatin (PARAPLATIN) 400 mg in sodium chloride 0.9 % 250 mL chemo infusion  400 mg Intravenous Once Alvy Bimler, Iliany Losier, MD       . diphenhydrAMINE (BENADRYL) injection 50 mg  50 mg Intravenous Once Alvy Bimler, Emmalynn Pinkham, MD      . famotidine (PEPCID) IVPB 20 mg premix  20 mg Intravenous Once Alvy Bimler, Quincy Boy, MD      . fosaprepitant (EMEND) 150 mg, dexamethasone (DECADRON) 12 mg in sodium chloride 0.9 % 145 mL IVPB   Intravenous Once Alvy Bimler, Shyleigh Daughtry, MD      . heparin lock flush 100 unit/mL  500 Units Intracatheter Once PRN Alvy Bimler, Penelopi Mikrut, MD      . PACLitaxel (TAXOL) 174 mg in sodium chloride 0.9 % 250 mL chemo infusion (> 70m/m2)  105 mg/m2 (Treatment Plan Recorded) Intravenous Once GAlvy Bimler Tanielle Emigh, MD      . palonosetron (ALOXI) injection 0.25 mg  0.25 mg Intravenous Once Shiv Shuey, MD      . sodium chloride flush (NS) 0.9 % injection 10 mL  10 mL Intracatheter PRN GAlvy Bimler Niva Murren, MD        PHYSICAL EXAMINATION: ECOG PERFORMANCE STATUS: 1 - Symptomatic but completely ambulatory  Vitals:   12/01/18 1139  BP: (!) 143/87  Pulse: 78  Resp: 18  Temp: 98.9 F (37.2 C)  SpO2: 100%   Filed Weights   12/01/18 1139  Weight: 142 lb 3.2 oz (64.5 kg)    GENERAL:alert, no distress and comfortable SKIN: skin color, texture, turgor are  normal, no rashes or significant lesions EYES: normal, Conjunctiva are pink and non-injected, sclera clear OROPHARYNX:no exudate, no erythema and lips, buccal mucosa, and tongue normal  NECK: supple, thyroid normal size, non-tender, without nodularity LYMPH:  no palpable lymphadenopathy in the cervical, axillary or inguinal LUNGS: clear to auscultation and percussion with normal breathing effort HEART: regular rate & rhythm and no murmurs and no lower extremity edema ABDOMEN:abdomen soft, non-tender and normal bowel sounds Musculoskeletal:no cyanosis of digits and no clubbing  NEURO: alert & oriented x 3 with fluent speech, no focal motor/sensory deficits  LABORATORY DATA:  I have reviewed the data as listed    Component Value Date/Time   NA 141 12/01/2018 1055   K 3.5 12/01/2018 1055   CL 107 12/01/2018  1055   CO2 20 (L) 12/01/2018 1055   GLUCOSE 145 (H) 12/01/2018 1055   BUN 25 (H) 12/01/2018 1055   CREATININE 0.75 12/01/2018 1055   CALCIUM 9.6 12/01/2018 1055   PROT 7.7 12/01/2018 1055   ALBUMIN 4.1 12/01/2018 1055   AST 18 12/01/2018 1055   ALT 28 12/01/2018 1055   ALKPHOS 72 12/01/2018 1055   BILITOT 0.3 12/01/2018 1055   GFRNONAA >60 12/01/2018 1055   GFRAA >60 12/01/2018 1055    No results found for: SPEP, UPEP  Lab Results  Component Value Date   WBC 9.1 12/01/2018   NEUTROABS 6.9 12/01/2018   HGB 10.9 (L) 12/01/2018   HCT 32.3 (L) 12/01/2018   MCV 100.0 12/01/2018   PLT 224 12/01/2018      Chemistry      Component Value Date/Time   NA 141 12/01/2018 1055   K 3.5 12/01/2018 1055   CL 107 12/01/2018 1055   CO2 20 (L) 12/01/2018 1055   BUN 25 (H) 12/01/2018 1055   CREATININE 0.75 12/01/2018 1055      Component Value Date/Time   CALCIUM 9.6 12/01/2018 1055   ALKPHOS 72 12/01/2018 1055   AST 18 12/01/2018 1055   ALT 28 12/01/2018 1055   BILITOT 0.3 12/01/2018 1055      All questions were answered. The patient knows to call the clinic with any problems, questions or concerns. No barriers to learning was detected.  I spent 15 minutes counseling the patient face to face. The total time spent in the appointment was 20 minutes and more than 50% was on counseling and review of test results  Heath Lark, MD 12/01/2018 12:06 PM

## 2018-12-01 NOTE — Assessment & Plan Note (Signed)
She has recovered fully from side effects of recent treatment We will proceed with minor dose adjustment due to her recent severe pancytopenia She will proceed with treatment as schedule After cycle 6, we will proceed with imaging study

## 2018-12-01 NOTE — Patient Instructions (Signed)
[facility] Discharge Instructions for Patients Receiving Chemotherapy  Today you received the following chemotherapy agents Paclitaxel (TAXOL) & Carboplatin (PARAPLATIN).  To help prevent nausea and vomiting after your treatment, we encourage you to take your nausea medication as prescribed.   If you develop nausea and vomiting that is not controlled by your nausea medication, call the clinic.   BELOW ARE SYMPTOMS THAT SHOULD BE REPORTED IMMEDIATELY:  *FEVER GREATER THAN 100.5 F  *CHILLS WITH OR WITHOUT FEVER  NAUSEA AND VOMITING THAT IS NOT CONTROLLED WITH YOUR NAUSEA MEDICATION  *UNUSUAL SHORTNESS OF BREATH  *UNUSUAL BRUISING OR BLEEDING  TENDERNESS IN MOUTH AND THROAT WITH OR WITHOUT PRESENCE OF ULCERS  *URINARY PROBLEMS  *BOWEL PROBLEMS  UNUSUAL RASH Items with * indicate a potential emergency and should be followed up as soon as possible.  Feel free to call the clinic should you have any questions or concerns. The clinic phone number is [phone].  Please show the CHEMO ALERT CARD at check-in to the Emergency Department and triage nurse.  Coronavirus (COVID-19) Are you at risk?  Are you at risk for the Coronavirus (COVID-19)?  To be considered HIGH RISK for Coronavirus (COVID-19), you have to meet the following criteria:  . Traveled to [place], [place], [place], [place] or [place]; or in the [place] to [place], [place], [place], or [place]; and have fever, cough, and shortness of breath within the last 2 weeks of travel OR . Been in close contact with a person diagnosed with COVID-19 within the last 2 weeks and have fever, cough, and shortness of breath . IF YOU DO NOT MEET THESE CRITERIA, YOU ARE CONSIDERED LOW RISK FOR COVID-19.  What to do if you are HIGH RISK for COVID-19?  . If you are having a medical emergency, call 911. . Seek medical care right away. Before you go to a doctor's office, urgent care or emergency department,  call ahead and tell them about your recent travel, contact with someone diagnosed with COVID-19, and your symptoms. You should receive instructions from your physician's office regarding next steps of care.  . When you arrive at healthcare provider, tell the healthcare staff immediately you have returned from visiting [place], [place], [place], [place] or [place]; or traveled in the [place] to [place], [place], [place], or [place]; in the last two weeks or you have been in close contact with a person diagnosed with COVID-19 in the last 2 weeks.   . Tell the health care staff about your symptoms: fever, cough and shortness of breath. . After you have been seen by a medical provider, you will be either: o Tested for (COVID-19) and discharged home on quarantine except to seek medical care if symptoms worsen, and asked to  - Stay home and avoid contact with others until you get your results (4-5 days)  - Avoid travel on public transportation if possible (such as bus, train, or airplane) or o Sent to the Emergency Department by EMS for evaluation, COVID-19 testing, and possible admission depending on your condition and test results.  What to do if you are LOW RISK for COVID-19?  Reduce your risk of any infection by using the same precautions used for avoiding the common cold or flu:  . Wash your hands often with soap and warm water for at least 20 seconds.  If soap and water are not readily available, use an alcohol-based hand sanitizer with at least 60% alcohol.  .   If coughing or sneezing, cover your mouth and nose by coughing or sneezing into the elbow areas of your shirt or coat, into a tissue or into your sleeve (not your hands). . Avoid shaking hands with others and consider head nods or verbal greetings only. . Avoid touching your eyes, nose, or mouth with unwashed hands.  . Avoid close contact with people who are sick. . Avoid places or events with large numbers of people in one  location, like concerts or sporting events. . Carefully consider travel plans you have or are making. . If you are planning any travel outside or inside the [place], visit the CDC's Travelers' Health webpage for the latest health notices. . If you have some symptoms but not all symptoms, continue to monitor at home and seek medical attention if your symptoms worsen. . If you are having a medical emergency, call 911.   ADDITIONAL HEALTHCARE OPTIONS FOR PATIENTS  [facility] / e-Visit: [email or web address]         [facility]: [phone]  [facility]: [phone]                   [facility]: [phone]   

## 2018-12-01 NOTE — Assessment & Plan Note (Signed)
This is stable.  As above, we will proceed with dose adjustment of treatment.

## 2018-12-07 ENCOUNTER — Telehealth: Payer: Self-pay | Admitting: Oncology

## 2018-12-07 NOTE — Telephone Encounter (Signed)
Christina Hickman called and wanted to review her upcoming appointments.  Went over her appointments on 12/22/18 and with Dr. Denman George on 01/12/19.  She said she has been feeling good and has not had any nausea.

## 2018-12-11 NOTE — Addendum Note (Signed)
Addended by: Gardiner Rhyme on: 12/11/2018 02:18 PM   Modules accepted: Orders

## 2018-12-12 ENCOUNTER — Ambulatory Visit: Payer: Medicare Other

## 2018-12-12 ENCOUNTER — Other Ambulatory Visit: Payer: Medicare Other

## 2018-12-12 ENCOUNTER — Ambulatory Visit: Payer: Medicare Other | Admitting: Hematology and Oncology

## 2018-12-15 ENCOUNTER — Ambulatory Visit: Payer: Medicare Other | Admitting: Gynecologic Oncology

## 2018-12-22 ENCOUNTER — Encounter: Payer: Self-pay | Admitting: Hematology and Oncology

## 2018-12-22 ENCOUNTER — Inpatient Hospital Stay: Payer: Medicare Other | Attending: Gynecologic Oncology

## 2018-12-22 ENCOUNTER — Other Ambulatory Visit: Payer: Self-pay

## 2018-12-22 ENCOUNTER — Inpatient Hospital Stay: Payer: Medicare Other

## 2018-12-22 ENCOUNTER — Other Ambulatory Visit: Payer: Self-pay | Admitting: Hematology and Oncology

## 2018-12-22 ENCOUNTER — Inpatient Hospital Stay (HOSPITAL_BASED_OUTPATIENT_CLINIC_OR_DEPARTMENT_OTHER): Payer: Medicare Other | Admitting: Hematology and Oncology

## 2018-12-22 VITALS — BP 165/85 | HR 80 | Temp 99.6°F | Resp 18 | Ht 59.0 in | Wt 146.1 lb

## 2018-12-22 DIAGNOSIS — G620 Drug-induced polyneuropathy: Secondary | ICD-10-CM

## 2018-12-22 DIAGNOSIS — C549 Malignant neoplasm of corpus uteri, unspecified: Secondary | ICD-10-CM

## 2018-12-22 DIAGNOSIS — C541 Malignant neoplasm of endometrium: Secondary | ICD-10-CM

## 2018-12-22 DIAGNOSIS — D61818 Other pancytopenia: Secondary | ICD-10-CM

## 2018-12-22 DIAGNOSIS — E538 Deficiency of other specified B group vitamins: Secondary | ICD-10-CM

## 2018-12-22 DIAGNOSIS — Z5111 Encounter for antineoplastic chemotherapy: Secondary | ICD-10-CM | POA: Diagnosis not present

## 2018-12-22 DIAGNOSIS — D539 Nutritional anemia, unspecified: Secondary | ICD-10-CM

## 2018-12-22 DIAGNOSIS — T451X5A Adverse effect of antineoplastic and immunosuppressive drugs, initial encounter: Secondary | ICD-10-CM

## 2018-12-22 DIAGNOSIS — C55 Malignant neoplasm of uterus, part unspecified: Secondary | ICD-10-CM | POA: Insufficient documentation

## 2018-12-22 LAB — CMP (CANCER CENTER ONLY)
ALT: 20 U/L (ref 0–44)
AST: 16 U/L (ref 15–41)
Albumin: 3.7 g/dL (ref 3.5–5.0)
Alkaline Phosphatase: 75 U/L (ref 38–126)
Anion gap: 12 (ref 5–15)
BUN: 14 mg/dL (ref 8–23)
CO2: 22 mmol/L (ref 22–32)
Calcium: 9 mg/dL (ref 8.9–10.3)
Chloride: 106 mmol/L (ref 98–111)
Creatinine: 0.76 mg/dL (ref 0.44–1.00)
GFR, Est AFR Am: >60 mL/min (ref >60)
GFR, Estimated: >60 mL/min (ref >60)
Glucose, Bld: 172 mg/dL — ABNORMAL HIGH (ref 70–99)
Potassium: 3.5 mmol/L (ref 3.5–5.1)
Sodium: 140 mmol/L (ref 135–145)
Total Bilirubin: 0.2 mg/dL — ABNORMAL LOW (ref 0.3–1.2)
Total Protein: 7.4 g/dL (ref 6.5–8.1)

## 2018-12-22 LAB — CBC WITH DIFFERENTIAL (CANCER CENTER ONLY)
Abs Immature Granulocytes: 0.04 10*3/uL (ref 0.00–0.07)
Basophils Absolute: 0 10*3/uL (ref 0.0–0.1)
Basophils Relative: 0 %
Eosinophils Absolute: 0 10*3/uL (ref 0.0–0.5)
Eosinophils Relative: 0 %
HCT: 28.6 % — ABNORMAL LOW (ref 36.0–46.0)
Hemoglobin: 9.8 g/dL — ABNORMAL LOW (ref 12.0–15.0)
Immature Granulocytes: 1 %
Lymphocytes Relative: 16 %
Lymphs Abs: 1.3 10*3/uL (ref 0.7–4.0)
MCH: 35.8 pg — ABNORMAL HIGH (ref 26.0–34.0)
MCHC: 34.3 g/dL (ref 30.0–36.0)
MCV: 104.4 fL — ABNORMAL HIGH (ref 80.0–100.0)
Monocytes Absolute: 0.3 10*3/uL (ref 0.1–1.0)
Monocytes Relative: 4 %
Neutro Abs: 6.4 10*3/uL (ref 1.7–7.7)
Neutrophils Relative %: 79 %
Platelet Count: 87 10*3/uL — ABNORMAL LOW (ref 150–400)
RBC: 2.74 MIL/uL — ABNORMAL LOW (ref 3.87–5.11)
RDW: 15.4 % (ref 11.5–15.5)
WBC Count: 8.1 10*3/uL (ref 4.0–10.5)
nRBC: 0 % (ref 0.0–0.2)

## 2018-12-22 MED ORDER — SODIUM CHLORIDE 0.9% FLUSH
10.0000 mL | INTRAVENOUS | Status: DC | PRN
  Administered 2018-12-22: 10 mL
  Filled 2018-12-22: qty 10

## 2018-12-22 MED ORDER — SODIUM CHLORIDE 0.9 % IV SOLN
105.0000 mg/m2 | Freq: Once | INTRAVENOUS | Status: AC
  Administered 2018-12-22: 174 mg via INTRAVENOUS
  Filled 2018-12-22: qty 29

## 2018-12-22 MED ORDER — PALONOSETRON HCL INJECTION 0.25 MG/5ML
INTRAVENOUS | Status: AC
  Filled 2018-12-22: qty 5

## 2018-12-22 MED ORDER — FAMOTIDINE IN NACL 20-0.9 MG/50ML-% IV SOLN
20.0000 mg | Freq: Once | INTRAVENOUS | Status: AC
  Administered 2018-12-22: 20 mg via INTRAVENOUS

## 2018-12-22 MED ORDER — CYANOCOBALAMIN 1000 MCG/ML IJ SOLN: 1000.0000 ug | Freq: Once | INTRAMUSCULAR | Status: DC

## 2018-12-22 MED ORDER — SODIUM CHLORIDE 0.9 % IV SOLN
Freq: Once | INTRAVENOUS | Status: AC
  Administered 2018-12-22: 12:00:00 via INTRAVENOUS
  Filled 2018-12-22: qty 250

## 2018-12-22 MED ORDER — SODIUM CHLORIDE 0.9 % IV SOLN
363.6000 mg | Freq: Once | INTRAVENOUS | Status: AC
  Administered 2018-12-22: 360 mg via INTRAVENOUS
  Filled 2018-12-22: qty 36

## 2018-12-22 MED ORDER — PALONOSETRON HCL INJECTION 0.25 MG/5ML
0.2500 mg | Freq: Once | INTRAVENOUS | Status: AC
  Administered 2018-12-22: 0.25 mg via INTRAVENOUS

## 2018-12-22 MED ORDER — DIPHENHYDRAMINE HCL 50 MG/ML IJ SOLN
50.0000 mg | Freq: Once | INTRAMUSCULAR | Status: AC
  Administered 2018-12-22: 50 mg via INTRAVENOUS

## 2018-12-22 MED ORDER — SODIUM CHLORIDE 0.9 % IV SOLN: 426.0000 mg | Freq: Once | INTRAVENOUS | Status: DC

## 2018-12-22 MED ORDER — SODIUM CHLORIDE 0.9 % IV SOLN
Freq: Once | INTRAVENOUS | Status: AC
  Administered 2018-12-22: 12:00:00 via INTRAVENOUS
  Filled 2018-12-22: qty 5

## 2018-12-22 MED ORDER — CYANOCOBALAMIN 1000 MCG/ML IJ SOLN
INTRAMUSCULAR | Status: AC
  Filled 2018-12-22: qty 1

## 2018-12-22 MED ORDER — DIPHENHYDRAMINE HCL 50 MG/ML IJ SOLN
INTRAMUSCULAR | Status: AC
  Filled 2018-12-22: qty 1

## 2018-12-22 MED ORDER — HEPARIN SOD (PORK) LOCK FLUSH 100 UNIT/ML IV SOLN
500.0000 [IU] | Freq: Once | INTRAVENOUS | Status: AC | PRN
  Administered 2018-12-22: 500 [IU]
  Filled 2018-12-22: qty 5

## 2018-12-22 MED ORDER — CYANOCOBALAMIN 1000 MCG/ML IJ SOLN
1000.0000 ug | Freq: Once | INTRAMUSCULAR | Status: AC
  Administered 2018-12-22: 1000 ug via INTRAMUSCULAR

## 2018-12-22 MED ORDER — SODIUM CHLORIDE 0.9% FLUSH
10.0000 mL | Freq: Once | INTRAVENOUS | Status: AC
  Administered 2018-12-22: 11:00:00 10 mL
  Filled 2018-12-22: qty 10

## 2018-12-22 MED ORDER — FAMOTIDINE IN NACL 20-0.9 MG/50ML-% IV SOLN
INTRAVENOUS | Status: AC
  Filled 2018-12-22: qty 50

## 2018-12-22 NOTE — Progress Notes (Signed)
[place] OFFICE PROGRESS NOTE  Patient Care Team: [name], DO as PCP - General (Family Medicine)  ASSESSMENT & PLAN:  Uterine cancer [facility]) Even though she tolerated treatment better, she has significant, persistent pancytopenia I plan to reduce the dose of carboplatin further After completion of treatment today, she will return next month for CT imaging and repeat blood work   Pancytopenia, acquired [facility]) She has persistent pancytopenia despite dose adjustment and delaying chemotherapy by 1 week We will proceed with further dose adjustment of carboplatin She does not need transfusion support  Vitamin B12 deficiency She was recently found to have severe vitamin B12 deficiency We will continue vitamin B12 replacement therapy I plan to recheck B12 level again next month  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  As above, we will proceed with dose adjustment of treatment.   Orders Placed This Encounter  Procedures  . CT Abdomen Pelvis W Contrast    Standing Status:   Future    Standing Expiration Date:   01/26/2020    Order Specific Question:   If indicated for the ordered procedure, I authorize the administration of contrast media per Radiology protocol    Answer:   Yes    Order Specific Question:   Preferred imaging location?    Answer:   [facility]    Order Specific Question:   Radiology Contrast Protocol - do NOT remove file path    Answer:   [email or web address]  . Vitamin B12    Standing Status:   Future    Standing Expiration Date:   01/26/2020    INTERVAL HISTORY: Please see below for problem oriented charting. She returns for further follow-up and final treatment She tolerated last cycle of treatment well She has good energy level No worsening neuropathy Denies recent nausea or vomiting No changes in bowel habits  SUMMARY OF ONCOLOGIC HISTORY: Oncology History Overview  Note  Her2 positive   Uterine cancer ([place])  06/06/2018 Initial Diagnosis   She presented with postmenopausal bleeding. Initial biopsy was negative for malignancy   06/06/2018 Pathology Results   She saw Dr Adah Perl in [place] on 06/06/18 and a pap was performed which showed ASCUS.   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 The left tube and ovary was adherent to the sigmoid colon and was not removed.    08/11/2018 Initial Diagnosis   Uterine cancer ([place])   08/15/2018 Imaging   CT scan of chest, abdomen and pelvis 1. Single indeterminate 8 mm aorto-caval retroperitoneal lymph node. No other signs metastatic disease within the chest, abdomen, or pelvis. 2. Moderate to large hiatal hernia. 3. Colonic diverticulosis. No radiographic evidence of diverticulitis.   08/15/2018 Cancer Staging   Staging form: Corpus Uteri - Carcinoma and Carcinosarcoma, AJCC 8th Edition - Pathologic: Stage Unknown (pT1b, pNX, cM0) - Signed by Heath Lark, MD on 08/15/2018   08/22/2018 Procedure   Status post right IJ port catheter placement   08/23/2018 -  Chemotherapy   The patient had carboplatin and taxol for  chemotherapy treatment.       REVIEW OF SYSTEMS:   Constitutional: Denies fevers, chills or abnormal weight loss Eyes: Denies blurriness of vision Ears, nose, mouth, throat, and face: Denies mucositis or sore throat Respiratory: Denies cough, dyspnea or wheezes Cardiovascular: Denies palpitation, chest  discomfort or lower extremity swelling Gastrointestinal:  Denies nausea, heartburn or change in bowel habits Skin: Denies abnormal skin rashes Lymphatics: Denies new lymphadenopathy or easy bruising Neurological:Denies numbness, tingling or new weaknesses Behavioral/Psych: Mood is stable, no new changes  All other systems were reviewed with the patient and are negative.  I have reviewed the past medical history, past surgical history, social history and family history with the patient and they are unchanged from previous note.  ALLERGIES:  is allergic to cephalexin; losartan; shellfish-derived products; sulfa antibiotics; serotonin; alendronate; beef-derived products; galactose; and lisinopril.  MEDICATIONS:  Current Outpatient Medications  Medication Sig Dispense Refill  . acetaminophen (TYLENOL) 500 MG tablet Take 500 mg by mouth every 6 (six) hours as needed.    Marland Kitchen amLODipine (NORVASC) 5 MG tablet     . busPIRone (BUSPAR) 10 MG tablet     . clonazePAM (KLONOPIN) 1 MG tablet clonazepam 1 mg tablet  Take 1 tablet twice a day by oral route as directed for 30 days.    Marland Kitchen dexamethasone (DECADRON) 4 MG tablet Take 2  tabs at the night before and 2 tab the morning of chemotherapy. After chemo, take 1 daily for 3 days in the morning with breakfast, every 3 weeks 30 tablet 11  . EPINEPHrine 0.3 mg/0.3 mL IJ SOAJ injection epinephrine 0.3 mg/0.3 mL injection, auto-injector  USE AS DIRECTED    . hydrOXYzine (ATARAX/VISTARIL) 50 MG tablet     . lidocaine-prilocaine (EMLA) cream Apply to affected area once 30 g 3  . naproxen sodium (ALEVE) 220 MG tablet Take 220 mg by mouth.    . Nebivolol HCl (BYSTOLIC) 20 MG TABS Bystolic 20 mg tablet  TAKE ONE TABLET BY MOUTH DAILY    . nitroGLYCERIN (NITROSTAT) 0.4 MG SL tablet nitroglycerin 0.4 mg sublingual tablet    . omeprazole (PRILOSEC) 40 MG capsule Take 40 mg by mouth daily.     . ondansetron (ZOFRAN) 8 MG tablet Take 1 tablet (8 mg total) by mouth  every 8 (eight) hours as needed for refractory nausea / vomiting. 30 tablet 1  . prochlorperazine (COMPAZINE) 10 MG tablet Take 1 tablet (10 mg total) by mouth every 6 (six) hours as needed (Nausea or vomiting). 30 tablet 1  . traMADol (ULTRAM) 50 MG tablet Take 2 tablets (100 mg total) by mouth every 6 (six) hours as needed. 60 tablet 0   No current facility-administered medications for this visit.    Facility-Administered Medications Ordered in Other Visits  Medication Dose Route Frequency Provider Last Rate Last Dose  . CARBOplatin (PARAPLATIN) 360 mg in sodium chloride 0.9 % 250 mL chemo infusion  360 mg Intravenous Once Alvy Bimler, Dujuan Stankowski, MD      . heparin lock flush 100 unit/mL  500 Units Intracatheter Once PRN Alvy Bimler, Alasdair Kleve, MD      . PACLitaxel (TAXOL) 174 mg in sodium chloride 0.9 % 250 mL chemo infusion (> 34m/m2)  105 mg/m2 (Treatment Plan Recorded) Intravenous Once Keniyah Gelinas, MD      . sodium chloride flush (NS) 0.9 % injection 10 mL  10 mL Intracatheter PRN GAlvy Bimler Dhaval Woo, MD        PHYSICAL EXAMINATION: ECOG PERFORMANCE STATUS: 1 - Symptomatic but completely  ambulatory  Vitals:   12/22/18 1102  BP: (!) 165/85  Pulse: 80  Resp: 18  Temp: 99.6 F (37.6 C)  SpO2: 100%   Filed Weights   12/22/18 1102  Weight: 146 lb 1.6 oz (66.3 kg)    GENERAL:alert, no distress and comfortable SKIN: skin color, texture, turgor are normal, no rashes or significant lesions EYES: normal, Conjunctiva are pink and non-injected, sclera clear OROPHARYNX:no exudate, no erythema and lips, buccal mucosa, and tongue normal  NECK: supple, thyroid normal size, non-tender, without nodularity LYMPH:  no palpable lymphadenopathy in the cervical, axillary or inguinal LUNGS: clear to auscultation and percussion with normal breathing effort HEART: regular rate & rhythm and no murmurs and no lower extremity edema ABDOMEN:abdomen soft, non-tender and normal bowel sounds Musculoskeletal:no cyanosis of digits and  no clubbing  NEURO: alert & oriented x 3 with fluent speech, no focal motor/sensory deficits  LABORATORY DATA:  I have reviewed the data as listed    Component Value Date/Time   NA 140 12/22/2018 1035   K 3.5 12/22/2018 1035   CL 106 12/22/2018 1035   CO2 22 12/22/2018 1035   GLUCOSE 172 (H) 12/22/2018 1035   BUN 14 12/22/2018 1035   CREATININE 0.76 12/22/2018 1035   CALCIUM 9.0 12/22/2018 1035   PROT 7.4 12/22/2018 1035   ALBUMIN 3.7 12/22/2018 1035   AST 16 12/22/2018 1035   ALT 20 12/22/2018 1035   ALKPHOS 75 12/22/2018 1035   BILITOT 0.2 (L) 12/22/2018 1035   GFRNONAA >60 12/22/2018 1035   GFRAA >60 12/22/2018 1035    No results found for: SPEP, UPEP  Lab Results  Component Value Date   WBC 8.1 12/22/2018   NEUTROABS 6.4 12/22/2018   HGB 9.8 (L) 12/22/2018   HCT 28.6 (L) 12/22/2018   MCV 104.4 (H) 12/22/2018   PLT 87 (L) 12/22/2018      Chemistry      Component Value Date/Time   NA 140 12/22/2018 1035   K 3.5 12/22/2018 1035   CL 106 12/22/2018 1035   CO2 22 12/22/2018 1035   BUN 14 12/22/2018 1035   CREATININE 0.76 12/22/2018 1035      Component Value Date/Time   CALCIUM 9.0 12/22/2018 1035   ALKPHOS 75 12/22/2018 1035   AST 16 12/22/2018 1035   ALT 20 12/22/2018 1035   BILITOT 0.2 (L) 12/22/2018 1035       All questions were answered. The patient knows to call the clinic with any problems, questions or concerns. No barriers to learning was detected.  I spent 25 minutes counseling the patient face to face. The total time spent in the appointment was 30 minutes and more than 50% was on counseling and review of test results  Heath Lark, MD 12/22/2018 1:05 PM

## 2018-12-22 NOTE — Patient Instructions (Signed)
[facility] Discharge Instructions for Patients Receiving Chemotherapy  Today you received the following chemotherapy agents Paclitaxel (TAXOL) & Carboplatin (PARAPLATIN).  To help prevent nausea and vomiting after your treatment, we encourage you to take your nausea medication as prescribed.   If you develop nausea and vomiting that is not controlled by your nausea medication, call the clinic.   BELOW ARE SYMPTOMS THAT SHOULD BE REPORTED IMMEDIATELY:  *FEVER GREATER THAN 100.5 F  *CHILLS WITH OR WITHOUT FEVER  NAUSEA AND VOMITING THAT IS NOT CONTROLLED WITH YOUR NAUSEA MEDICATION  *UNUSUAL SHORTNESS OF BREATH  *UNUSUAL BRUISING OR BLEEDING  TENDERNESS IN MOUTH AND THROAT WITH OR WITHOUT PRESENCE OF ULCERS  *URINARY PROBLEMS  *BOWEL PROBLEMS  UNUSUAL RASH Items with * indicate a potential emergency and should be followed up as soon as possible.  Feel free to call the clinic should you have any questions or concerns. The clinic phone number is [phone].  Please show the CHEMO ALERT CARD at check-in to the Emergency Department and triage nurse.  Coronavirus (COVID-19) Are you at risk?  Are you at risk for the Coronavirus (COVID-19)?  To be considered HIGH RISK for Coronavirus (COVID-19), you have to meet the following criteria:  . Traveled to [place], [place], [place], [place] or [place]; or in the [place] to [place], [place], [place], or [place]; and have fever, cough, and shortness of breath within the last 2 weeks of travel OR . Been in close contact with a person diagnosed with COVID-19 within the last 2 weeks and have fever, cough, and shortness of breath . IF YOU DO NOT MEET THESE CRITERIA, YOU ARE CONSIDERED LOW RISK FOR COVID-19.  What to do if you are HIGH RISK for COVID-19?  . If you are having a medical emergency, call 911. . Seek medical care right away. Before you go to a doctor's office, urgent care or emergency department,  call ahead and tell them about your recent travel, contact with someone diagnosed with COVID-19, and your symptoms. You should receive instructions from your physician's office regarding next steps of care.  . When you arrive at healthcare provider, tell the healthcare staff immediately you have returned from visiting [place], [place], [place], [place] or [place]; or traveled in the [place] to [place], [place], [place], or [place]; in the last two weeks or you have been in close contact with a person diagnosed with COVID-19 in the last 2 weeks.   . Tell the health care staff about your symptoms: fever, cough and shortness of breath. . After you have been seen by a medical provider, you will be either: o Tested for (COVID-19) and discharged home on quarantine except to seek medical care if symptoms worsen, and asked to  - Stay home and avoid contact with others until you get your results (4-5 days)  - Avoid travel on public transportation if possible (such as bus, train, or airplane) or o Sent to the Emergency Department by EMS for evaluation, COVID-19 testing, and possible admission depending on your condition and test results.  What to do if you are LOW RISK for COVID-19?  Reduce your risk of any infection by using the same precautions used for avoiding the common cold or flu:  . Wash your hands often with soap and warm water for at least 20 seconds.  If soap and water are not readily available, use an alcohol-based hand sanitizer with at least 60% alcohol.  .   If coughing or sneezing, cover your mouth and nose by coughing or sneezing into the elbow areas of your shirt or coat, into a tissue or into your sleeve (not your hands). . Avoid shaking hands with others and consider head nods or verbal greetings only. . Avoid touching your eyes, nose, or mouth with unwashed hands.  . Avoid close contact with people who are sick. . Avoid places or events with large numbers of people in one  location, like concerts or sporting events. . Carefully consider travel plans you have or are making. . If you are planning any travel outside or inside the [place], visit the CDC's Travelers' Health webpage for the latest health notices. . If you have some symptoms but not all symptoms, continue to monitor at home and seek medical attention if your symptoms worsen. . If you are having a medical emergency, call 911.   ADDITIONAL HEALTHCARE OPTIONS FOR PATIENTS  [facility] / e-Visit: [email or web address]         [facility]: [phone]  [facility]: [phone]                   [facility]: [phone]   

## 2018-12-22 NOTE — Assessment & Plan Note (Signed)
She has persistent pancytopenia despite dose adjustment and delaying chemotherapy by 1 week We will proceed with further dose adjustment of carboplatin She does not need transfusion support

## 2018-12-22 NOTE — Assessment & Plan Note (Signed)
She was recently found to have severe vitamin B12 deficiency We will continue vitamin B12 replacement therapy I plan to recheck B12 level again next month

## 2018-12-22 NOTE — Assessment & Plan Note (Signed)
Even though she tolerated treatment better, she has significant, persistent pancytopenia I plan to reduce the dose of carboplatin further After completion of treatment today, she will return next month for CT imaging and repeat blood work

## 2018-12-22 NOTE — Assessment & Plan Note (Signed)
This is stable.  As above, we will proceed with dose adjustment of treatment.

## 2018-12-25 ENCOUNTER — Telehealth: Payer: Self-pay | Admitting: Hematology and Oncology

## 2018-12-25 NOTE — Telephone Encounter (Signed)
Scheduled appt per 11/20 sch message - pt is aware of appt date and time   

## 2019-01-12 ENCOUNTER — Ambulatory Visit: Payer: Medicare Other | Admitting: Gynecologic Oncology

## 2019-01-22 ENCOUNTER — Telehealth: Payer: Self-pay

## 2019-01-22 NOTE — Telephone Encounter (Signed)
She called and left a message to call her.   Called back. Spoke with patient and husband. She has a hemorrhoid on the left side of buttock that feels like a golf ball.  She talked with her PCP on Saturday and was given Anusol. Instructed her and husband to call PCP back, go to urgent care or local ER.  Repeated multiple times. Husband and patient would not listen. Tried multiple times to instruct on sitz baths, they both said she was too uncomfortable to do a sitz bath. They are both upset, yelling and would not listen. Husband eventually said he would call PCP and that [facility] is useless. He then slammed the phone down.

## 2019-01-24 ENCOUNTER — Telehealth: Payer: Self-pay | Admitting: *Deleted

## 2019-01-24 NOTE — Telephone Encounter (Signed)
F/U call with pt. Pt requested to cancel Monday appts due to recent infection. Asked to call to reschedule appt with Dr. Alvy Bimler. Pt verbalized understanding Scheduling message sent for pt cancel/reschduling

## 2019-01-29 ENCOUNTER — Other Ambulatory Visit: Payer: Medicare Other

## 2019-01-29 ENCOUNTER — Ambulatory Visit (HOSPITAL_COMMUNITY): Payer: Medicare Other

## 2019-01-30 ENCOUNTER — Ambulatory Visit: Payer: Medicare Other | Admitting: Hematology and Oncology

## 2019-02-14 ENCOUNTER — Telehealth: Payer: Self-pay | Admitting: Oncology

## 2019-02-14 NOTE — Telephone Encounter (Signed)
Called [place] regarding scheduling CT and follow up apt.  She said she saw her PCP yesterday and he is going to transfer her cancer care to an oncologist in [place], [place] that is closer to home.  She does not want to reschedule her appointments in [place].  She said she is feeling better and had an infection in her rectal area "from chemotherapy."

## 2019-02-14 NOTE — Telephone Encounter (Signed)
Lynda Rainwater of message from Dr. Alvy Bimler.  She said that she discussed getting the port removed yesterday with her PCP and PA.  They both told her it is up to her on whether it should be removed.  She said she was glad she had it when she was in the hospital.  They did a CT and ran a bunch of tests so she is sure it is not infected.  Told her again that Dr. Alvy Bimler is recommending that she have it removed to reduce the risk of infection and that she can either have it removed with her new oncologist or that Dr. Alvy Bimler can order it.  She verbalized understanding and said she will discuss it with her new oncologist.

## 2019-02-14 NOTE — Telephone Encounter (Signed)
I suggest removing her port to reduce risk of infection If she wants to wait for her new oncologist to order that, it's ok or I can order it to be removed

## 2019-03-07 ENCOUNTER — Telehealth: Payer: Self-pay

## 2019-03-29 ENCOUNTER — Ambulatory Visit
Admission: RE | Admit: 2019-03-29 | Discharge: 2019-03-29 | Disposition: A | Discharge status: Home / Self Care | Payer: Medicare Other | Source: Ambulatory Visit | Attending: Radiation Oncology | Admitting: Radiation Oncology

## 2019-03-29 NOTE — Progress Notes (Incomplete)
?  Radiation Oncology         [phone] ?________________________________ ? ?Name: Christina Hickman MRN: [id number]  ?Date: 03/29/2019  DOB: 08-Jun-1950 ? ?Follow-Up Visit Note ? ?CC: [name], DO  [name], DO ? ?No diagnosis found. ? 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 ?Interval Since Last Radiation:  5 months, 3 weeks ?09/04/2018 through 10/02/2018 ?Site Technique Total Dose Dose per Fx Completed Fx Beam Energies  ?Pelvis: Pelvis_v cuff HDR Ir192 30/30 6 5/5 ?  ? ?Narrative:  The patient returns today for routine follow-up. She is currently transferring her cancer care to an oncologist in [place], [place] since that is closer to her home. ? ?On review of systems, she *** ? ?ALLERGIES:  is allergic to cephalexin; losartan; shellfish-derived products; sulfa antibiotics; serotonin; alendronate; beef-derived products; galactose; and lisinopril. ? ?Meds: ?Current Outpatient Medications  ?Medication Sig Dispense Refill  ?? acetaminophen (TYLENOL) 500 MG tablet Take 500 mg by mouth every 6 (six) hours as needed.    ?? amLODipine (NORVASC) 5 MG tablet     ?? busPIRone (BUSPAR) 10 MG tablet     ?? clonazePAM (KLONOPIN) 1 MG tablet clonazepam 1 mg tablet ? Take 1 tablet twice a day by oral route as directed for 30 days.    ?? dexamethasone (DECADRON) 4 MG tablet Take 2  tabs at the night before and 2 tab the morning of chemotherapy. After chemo, take 1 daily for 3 days in the morning with breakfast, every 3 weeks 30 tablet 11  ?? EPINEPHrine 0.3 mg/0.3 mL IJ SOAJ injection epinephrine 0.3 mg/0.3 mL injection, auto-injector ? USE AS DIRECTED    ?? hydrOXYzine (ATARAX/VISTARIL) 50 MG tablet     ?? lidocaine-prilocaine (EMLA) cream Apply to affected area once 30 g 3  ?? naproxen sodium (ALEVE) 220 MG tablet Take 220 mg by mouth.    ?? Nebivolol HCl (BYSTOLIC) 20 MG TABS Bystolic 20 mg tablet ? TAKE ONE TABLET BY MOUTH DAILY    ? ? nitroGLYCERIN (NITROSTAT) 0.4 MG SL tablet nitroglycerin 0.4 mg sub

## 2019-08-07 NOTE — Telephone Encounter (Signed)
This encounter was in error.  This was in our training period.

## 2020-08-01 ENCOUNTER — Encounter: Payer: Self-pay | Admitting: Hematology and Oncology

## 2020-10-14 IMAGING — CT CT ABDOMEN AND PELVIS WITH CONTRAST
2 of 6 series · 15 of 46 positions shown, 17 images · IV contrast (OMNIPAQUE)
Comparison: None.

CLINICAL DATA: Newly diagnosed endometrial carcinoma. Status
hysterectomy 3 weeks ago. Staging.

EXAM:
CT CHEST, ABDOMEN, AND PELVIS WITH CONTRAST
TECHNIQUE: Multidetector CT imaging of the chest, abdomen and pelvis was
performed following the standard protocol during bolus
administration of intravenous contrast.
CONTRAST:  100mL OMNIPAQUE IOHEXOL 300 MG/ML  SOLN

[Series 2: cap with · axial · 0.73mm/px · z∈[+1124,+1620]mm · 12 of 117 slices shown, 14 images]
[im 9/117  soft-tissue]
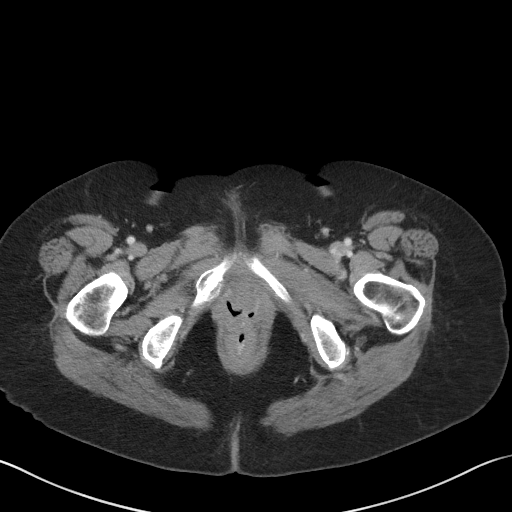
[im 9/117  bone]
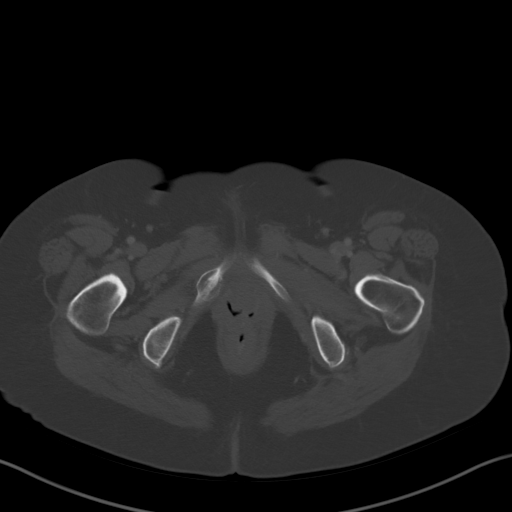
[im 18/117  soft-tissue]
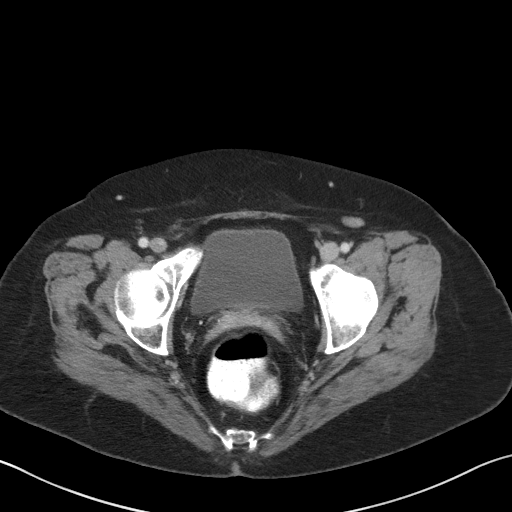
[im 27/117  soft-tissue]
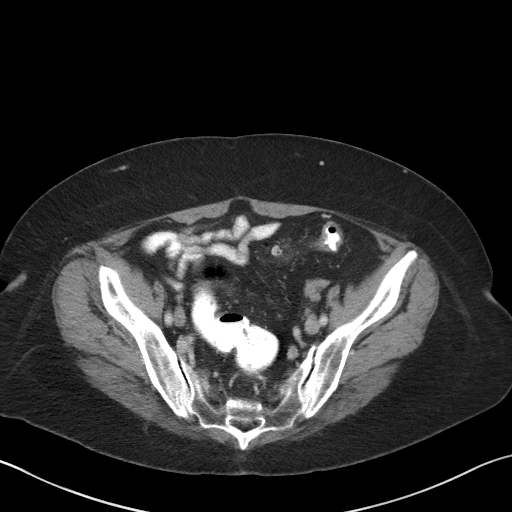
[im 36/117  soft-tissue]
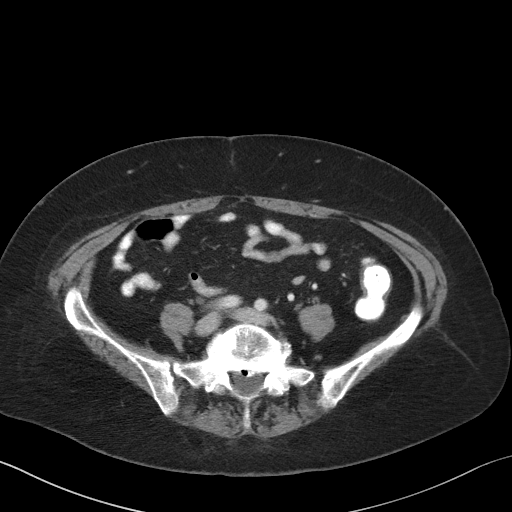
[im 45/117  soft-tissue]
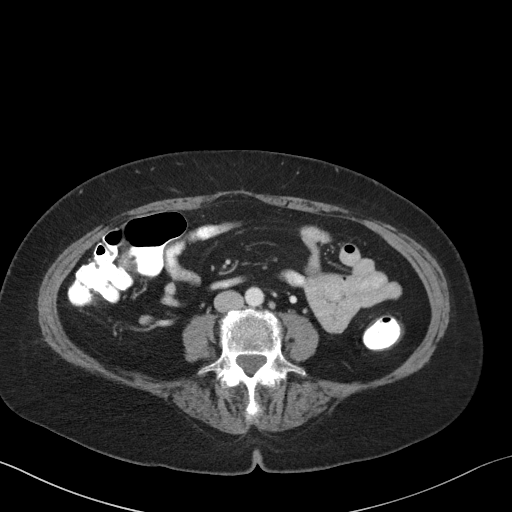
[im 54/117  soft-tissue]
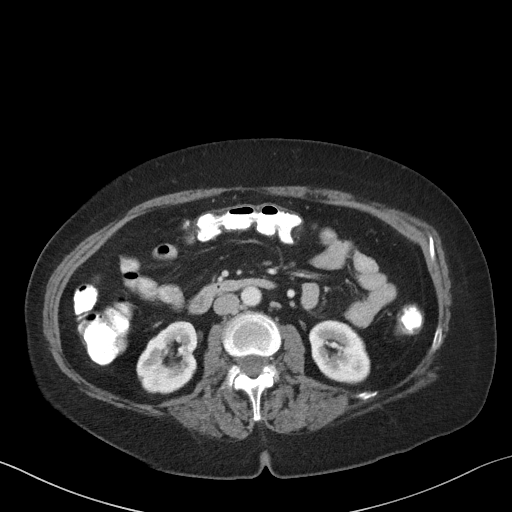
[im 63/117  soft-tissue]
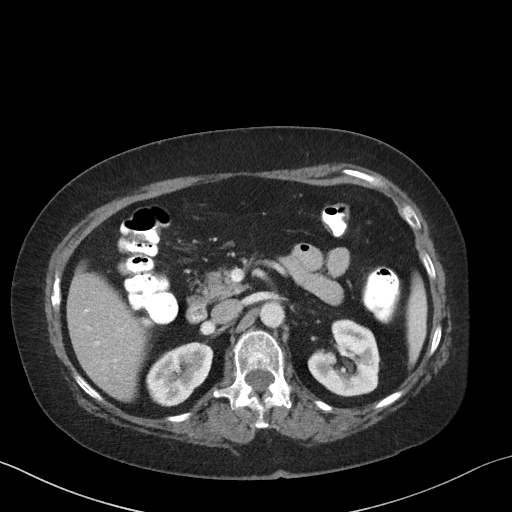
[im 72/117  soft-tissue]
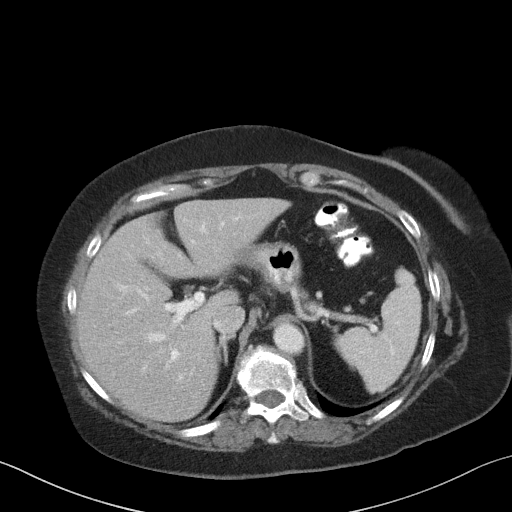
[im 81/117  soft-tissue]
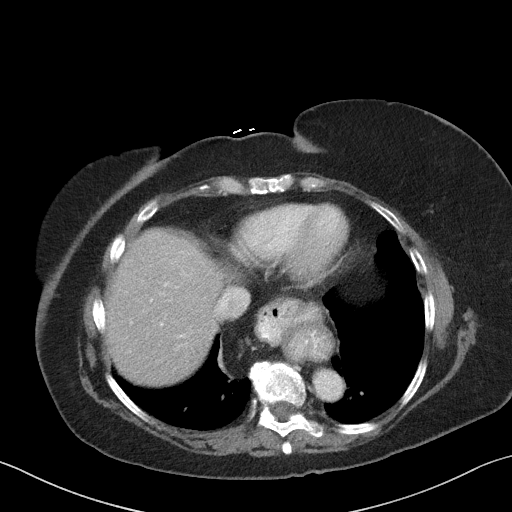
[im 81/117  bone]
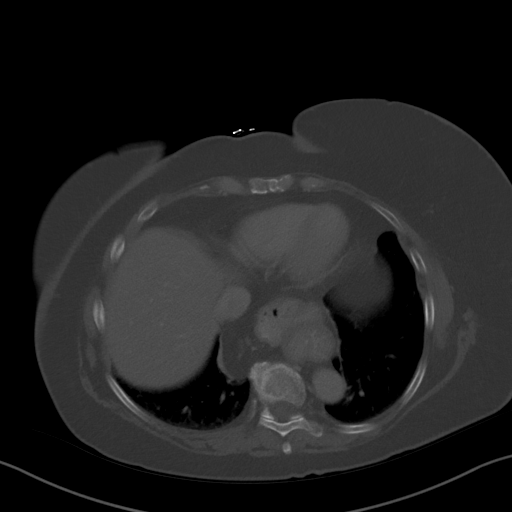
[im 90/117  soft-tissue]
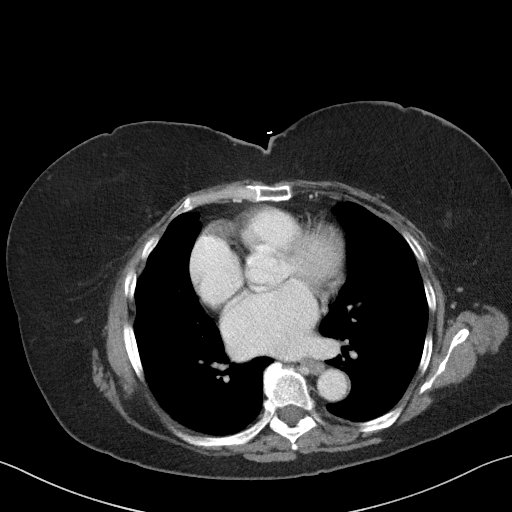
[im 99/117  soft-tissue]
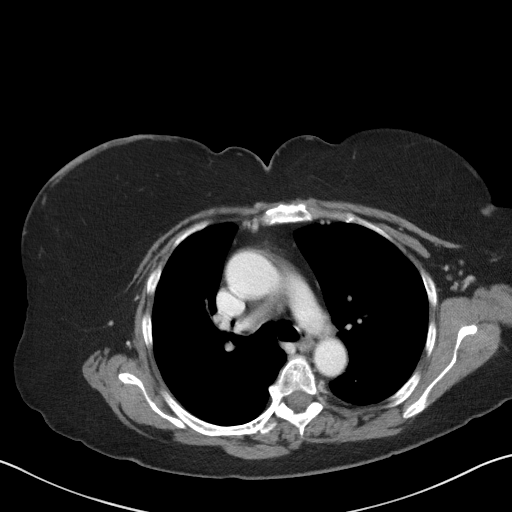
[im 108/117  soft-tissue]
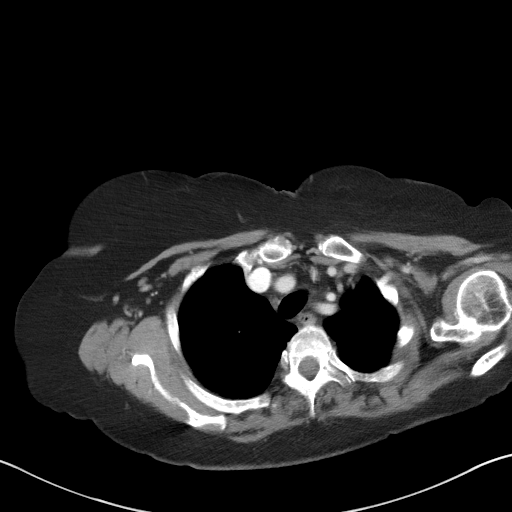

[Series 5: coronals · coronal · 0.68mm/px · 3 of 120 slices shown]
[im 40/120  soft-tissue]
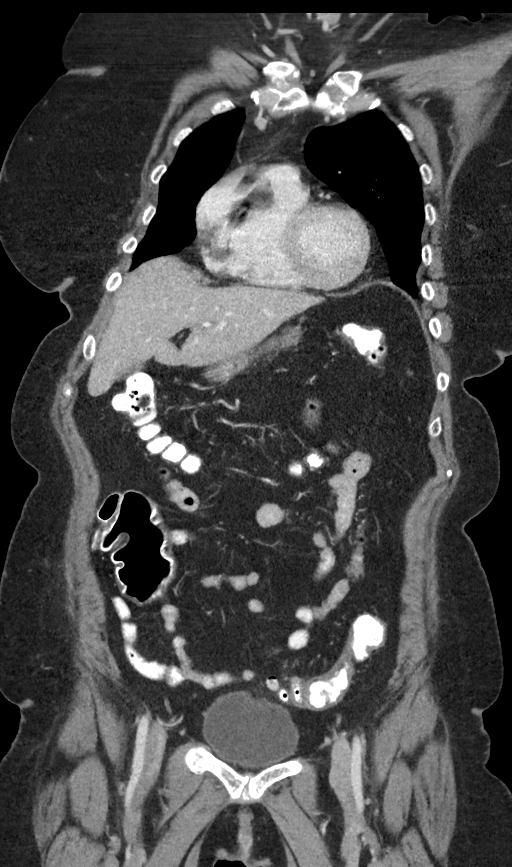
[im 53/120  soft-tissue]
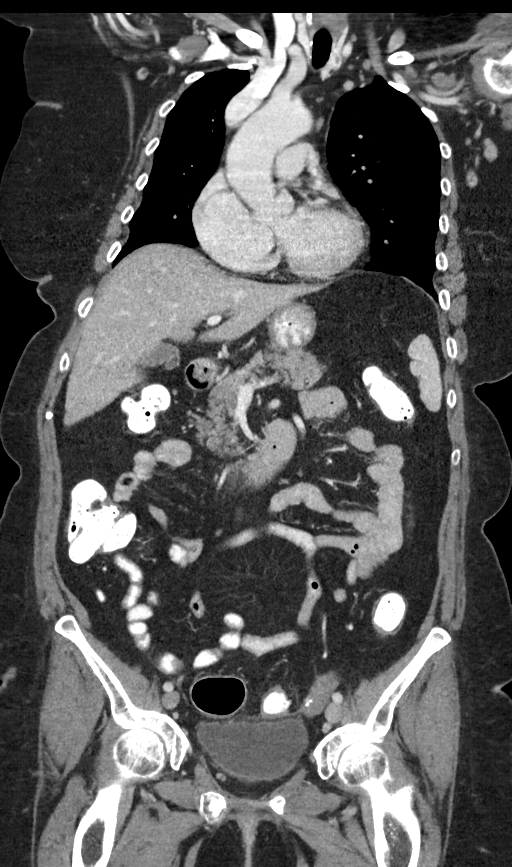
[im 67/120  soft-tissue]
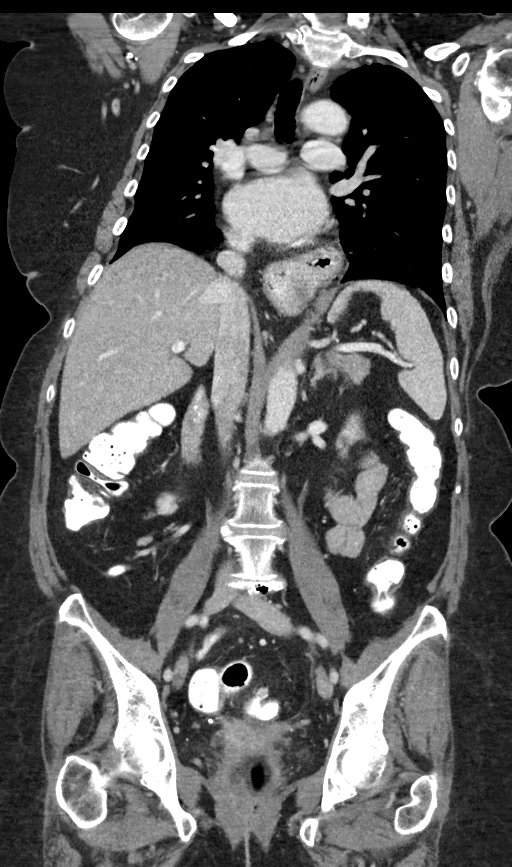

[15 of 46 positions shown; findings below may reference images not displayed]

FINDINGS: CT CHEST FINDINGS

Cardiovascular: No acute findings.

Mediastinum/Lymph Nodes: No masses or pathologically enlarged lymph
nodes identified.

Lungs/Pleura: No pulmonary infiltrate or mass identified. No
effusion present.

Musculoskeletal:  No suspicious bone lesions identified.

CT ABDOMEN AND PELVIS FINDINGS

Hepatobiliary: No masses identified. Gallbladder is unremarkable.

Pancreas:  No mass or inflammatory changes.

Spleen:  Within normal limits in size and appearance.

Adrenals/Urinary tract:  No masses or hydronephrosis.

Stomach/Bowel: Moderate to large hiatal hernia. No evidence of
obstruction, inflammatory process, or abnormal fluid collections.
Diverticulosis is seen mainly involving the sigmoid colon, however
there is no evidence of diverticulitis.

Vascular/Lymphatic: No pathologically enlarged lymph nodes
identified. An aortocaval lymph node measuring 8 mm is noted image
69/2. No abdominal aortic aneurysm.

Reproductive: Expected postop changes from hysterectomy. No pelvic
mass or abnormal fluid collections identified.

Other:  None.

Musculoskeletal: No suspicious bone lesions identified. Old benign
appearing fracture deformities seen involving the right superior and
inferior pubic rami.
IMPRESSION: 1. Single indeterminate 8 mm aorto-caval retroperitoneal lymph node.
No other signs metastatic disease within the chest, abdomen, or
pelvis.
2. Moderate to large hiatal hernia.
3. Colonic diverticulosis. No radiographic evidence of
diverticulitis.

## 2020-10-21 IMAGING — XA IR LEFT FLUORO GUIDE CV LINE
1 series · 1 of 1 positions shown · non-contrast
Comparison: none

INDICATION: 68-year-old female with a history of gynecologic malignancy,
referred for port catheter

[Series 300: ir imaging guided port insertion · 1 of 1 slices shown]
[im 1/1]
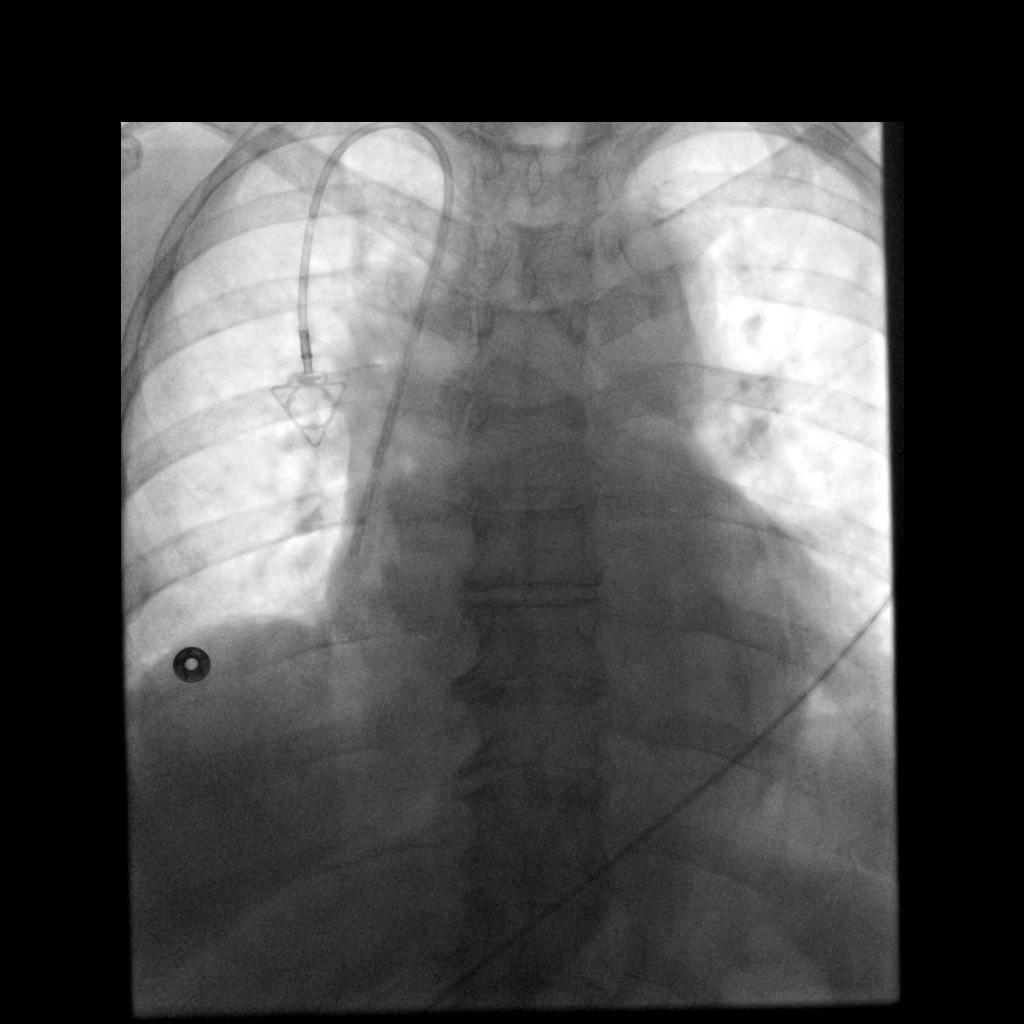

[1 of 1 positions shown; findings below may reference images not displayed]

EXAM:
IMPLANTED PORT A CATH PLACEMENT WITH ULTRASOUND AND FLUOROSCOPIC
GUIDANCE

MEDICATIONS:
900 mg clindamycin; The antibiotic was administered within an
appropriate time interval prior to skin puncture.

ANESTHESIA/SEDATION:
Moderate (conscious) sedation was employed during this procedure. A
total of Versed 2.0 mg and Fentanyl 50 mcg was administered
intravenously.

Moderate Sedation Time: 18 minutes. The patient's level of
consciousness and vital signs were monitored continuously by
radiology nursing throughout the procedure under my direct
supervision.

FLUOROSCOPY TIME:  0 minutes, 6 seconds (1 mGy)

COMPLICATIONS:
None

PROCEDURE:
The procedure, risks, benefits, and alternatives were explained to
the patient. Questions regarding the procedure were encouraged and
answered. The patient understands and consents to the procedure.

Ultrasound survey was performed with images stored and sent to PACs.

The right neck and chest was prepped with chlorhexidine, and draped
in the usual sterile fashion using maximum barrier technique (cap
and mask, sterile gown, sterile gloves, large sterile sheet, hand
hygiene and cutaneous antiseptic). Antibiotic prophylaxis was
provided with 2.0g Ancef administered IV one hour prior to skin
incision. Local anesthesia was attained by infiltration with 1%
lidocaine without epinephrine.

Ultrasound demonstrated patency of the right internal jugular vein,
and this was documented with an image. Under real-time ultrasound
guidance, this vein was accessed with a 21 gauge micropuncture
needle and image documentation was performed. A small dermatotomy
was made at the access site with an 11 scalpel. A 0.018" wire was
advanced into the SVC and used to estimate the length of the
internal catheter. The access needle exchanged for a 4F
micropuncture vascular sheath. The 0.018" wire was then removed and
a 0.035" wire advanced into the IVC.



The venous access site was then serially dilated and a peel away
vascular sheath placed over the wire. The wire was removed and the
port catheter advanced into position under fluoroscopic guidance.
The catheter tip is positioned in the cavoatrial junction. This was
documented with a spot image. The portacatheter was then tested and
found to flush and aspirate well. The port was flushed with saline
followed by 100 units/mL heparinized saline.

The pocket was then closed in two layers using first subdermal
inverted interrupted absorbable sutures followed by a running
subcuticular suture. The epidermis was then sealed with Dermabond.
The dermatotomy at the venous access site was also seal with
Dermabond.

Patient tolerated the procedure well and remained hemodynamically
stable throughout.

No complications encountered and no significant blood loss
encountered
IMPRESSION: Status post right IJ port catheter placement.

## 2020-12-11 ENCOUNTER — Other Ambulatory Visit (HOSPITAL_COMMUNITY): Payer: Self-pay | Admitting: Nurse Practitioner

## 2020-12-11 DIAGNOSIS — C541 Malignant neoplasm of endometrium: Secondary | ICD-10-CM

## 2020-12-18 ENCOUNTER — Encounter (HOSPITAL_COMMUNITY)
Admission: RE | Admit: 2020-12-18 | Discharge: 2020-12-18 | Disposition: A | Discharge status: Home / Self Care | Payer: Medicare Other | Source: Ambulatory Visit | Attending: Nurse Practitioner | Admitting: Nurse Practitioner

## 2020-12-18 ENCOUNTER — Other Ambulatory Visit: Payer: Self-pay

## 2020-12-18 DIAGNOSIS — C541 Malignant neoplasm of endometrium: Secondary | ICD-10-CM | POA: Insufficient documentation

## 2020-12-18 MED ORDER — FLUDEOXYGLUCOSE F - 18 (FDG) INJECTION
7.5300 | Freq: Once | INTRAVENOUS | Status: AC | PRN
  Administered 2020-12-18: 11:00:00 7.53 via INTRAVENOUS

## 2024-03-04 DEATH — deceased
